# Patient Record
Sex: Female | Born: 1980
Health system: Southern US, Community
[De-identification: ages and names within clinical notes are randomized; demographics above are authoritative.]

## PROBLEM LIST (undated history)

## (undated) DIAGNOSIS — R87619 Unspecified abnormal cytological findings in specimens from cervix uteri: Secondary | ICD-10-CM

## (undated) DIAGNOSIS — N393 Stress incontinence (female) (male): Secondary | ICD-10-CM

## (undated) DIAGNOSIS — F329 Major depressive disorder, single episode, unspecified: Secondary | ICD-10-CM

## (undated) DIAGNOSIS — B379 Candidiasis, unspecified: Secondary | ICD-10-CM

## (undated) DIAGNOSIS — Z8742 Personal history of other diseases of the female genital tract: Secondary | ICD-10-CM

## (undated) DIAGNOSIS — E282 Polycystic ovarian syndrome: Secondary | ICD-10-CM

## (undated) DIAGNOSIS — N3090 Cystitis, unspecified without hematuria: Secondary | ICD-10-CM

## (undated) DIAGNOSIS — K219 Gastro-esophageal reflux disease without esophagitis: Secondary | ICD-10-CM

## (undated) DIAGNOSIS — IMO0002 Reserved for concepts with insufficient information to code with codable children: Secondary | ICD-10-CM

## (undated) DIAGNOSIS — N870 Mild cervical dysplasia: Secondary | ICD-10-CM

## (undated) DIAGNOSIS — Z87898 Personal history of other specified conditions: Secondary | ICD-10-CM

## (undated) DIAGNOSIS — E039 Hypothyroidism, unspecified: Secondary | ICD-10-CM

## (undated) DIAGNOSIS — A499 Bacterial infection, unspecified: Secondary | ICD-10-CM

## (undated) DIAGNOSIS — F419 Anxiety disorder, unspecified: Secondary | ICD-10-CM

## (undated) DIAGNOSIS — Z8619 Personal history of other infectious and parasitic diseases: Secondary | ICD-10-CM

## (undated) DIAGNOSIS — R222 Localized swelling, mass and lump, trunk: Secondary | ICD-10-CM

## (undated) HISTORY — DX: Anxiety disorder, unspecified: F41.9

## (undated) HISTORY — DX: Reserved for concepts with insufficient information to code with codable children: IMO0002

## (undated) HISTORY — DX: Candidiasis, unspecified: B37.9

## (undated) HISTORY — DX: Personal history of other infectious and parasitic diseases: Z86.19

## (undated) HISTORY — DX: Bacterial infection, unspecified: A49.9

## (undated) HISTORY — DX: Unspecified abnormal cytological findings in specimens from cervix uteri: R87.619

## (undated) HISTORY — PX: WISDOM TOOTH EXTRACTION: SHX21

## (undated) HISTORY — DX: Stress incontinence (female) (male): N39.3

## (undated) HISTORY — DX: Major depressive disorder, single episode, unspecified: F32.9

## (undated) HISTORY — DX: Mild cervical dysplasia: N87.0

## (undated) HISTORY — PX: NO PAST SURGERIES: SHX2092

## (undated) HISTORY — DX: Cystitis, unspecified without hematuria: N30.90

## (undated) HISTORY — DX: Personal history of other specified conditions: Z87.898

---
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
## 2003-10-19 DIAGNOSIS — Z87898 Personal history of other specified conditions: Secondary | ICD-10-CM

## 2003-10-19 HISTORY — DX: Personal history of other specified conditions: Z87.898

## 2004-05-19 ENCOUNTER — Other Ambulatory Visit: Admission: RE | Admit: 2004-05-19 | Discharge: 2004-05-19 | Payer: Self-pay | Admitting: Obstetrics and Gynecology

## 2005-05-27 ENCOUNTER — Other Ambulatory Visit: Admission: RE | Admit: 2005-05-27 | Discharge: 2005-05-27 | Payer: Self-pay | Admitting: Obstetrics and Gynecology

## 2006-08-10 ENCOUNTER — Other Ambulatory Visit: Admission: RE | Admit: 2006-08-10 | Discharge: 2006-08-10 | Payer: Self-pay | Admitting: Obstetrics and Gynecology

## 2008-10-18 DIAGNOSIS — N870 Mild cervical dysplasia: Secondary | ICD-10-CM

## 2008-10-18 HISTORY — PX: COLPOSCOPY: SHX161

## 2008-10-18 HISTORY — DX: Mild cervical dysplasia: N87.0

## 2009-10-18 DIAGNOSIS — F32A Depression, unspecified: Secondary | ICD-10-CM

## 2009-10-18 DIAGNOSIS — F419 Anxiety disorder, unspecified: Secondary | ICD-10-CM

## 2009-10-18 HISTORY — DX: Depression, unspecified: F32.A

## 2009-10-18 HISTORY — DX: Anxiety disorder, unspecified: F41.9

## 2011-03-19 ENCOUNTER — Ambulatory Visit: Payer: Self-pay | Admitting: General Practice

## 2012-01-26 ENCOUNTER — Ambulatory Visit: Payer: Self-pay | Admitting: Obstetrics and Gynecology

## 2012-02-16 DIAGNOSIS — N870 Mild cervical dysplasia: Secondary | ICD-10-CM | POA: Insufficient documentation

## 2012-02-16 DIAGNOSIS — Z8619 Personal history of other infectious and parasitic diseases: Secondary | ICD-10-CM | POA: Insufficient documentation

## 2012-02-17 ENCOUNTER — Encounter: Payer: Self-pay | Admitting: Obstetrics and Gynecology

## 2012-02-17 ENCOUNTER — Ambulatory Visit (INDEPENDENT_AMBULATORY_CARE_PROVIDER_SITE_OTHER): Payer: Commercial Indemnity | Admitting: Obstetrics and Gynecology

## 2012-02-17 VITALS — BP 106/62 | Ht 65.0 in | Wt 141.0 lb

## 2012-02-17 DIAGNOSIS — Z124 Encounter for screening for malignant neoplasm of cervix: Secondary | ICD-10-CM

## 2012-02-17 DIAGNOSIS — Z01419 Encounter for gynecological examination (general) (routine) without abnormal findings: Secondary | ICD-10-CM

## 2012-02-17 MED ORDER — DROSPIRENONE-ETHINYL ESTRADIOL 3-0.02 MG PO TABS: 1.0000 | ORAL_TABLET | Freq: Every day | ORAL | Status: DC

## 2012-02-17 NOTE — Progress Notes (Signed)
The patient reports:normal menses, no abnormal bleeding, pelvic pain or discharge  Contraception:oral contraceptives (estrogen/progesterone) yaz  Last mammogram: patient has never had a mammogram  Last pap: was normal March  2012  GC/Chlamydia cultures offered: declined HIV/RPR/HbsAg offered:  declined HSV 1 and 2 glycoprotein offered: declined  Menstrual cycle regular and monthly: Yes Menstrual flow normal: Yes  Urinary symptoms: none Normal bowel movements: Yes Reports abuse at home: No   Subjective:    Alicia KLOEPPEL is a 31 y.o. female, G0P0, who presents for an annual exam.     History   Social History  . Marital Status: Unknown    Spouse Name: N/A    Number of Children: N/A  . Years of Education: N/A   Social History Main Topics  . Smoking status: Never Smoker   . Smokeless tobacco: Never Used  . Alcohol Use: 6.0 oz/week    10 Cans of beer per week  . Drug Use: No  . Sexually Active: Yes    Birth Control/ Protection: Pill   Other Topics Concern  . None   Social History Narrative  . None    Menstrual cycle:   LMP: Patient's last menstrual period was 02/10/2012.           Cycle: Regular, monthly with normal flow and no severe dysmenorrha  The following portions of the patient's history were reviewed and updated as appropriate: allergies, current medications, past family history, past medical history, past social history, past surgical history and problem list.  Review of Systems Pertinent items are noted in HPI. Breast:Negative for breast lump,nipple discharge or nipple retraction Gastrointestinal: Negative for abdominal pain, change in bowel habits or rectal bleeding Urinary:negative   Objective:    BP 106/62  Ht 5\' 5"  (1.651 m)  Wt 141 lb (63.957 kg)  BMI 23.46 kg/m2  LMP 02/10/2012    Weight:  Wt Readings from Last 1 Encounters:  02/17/12 141 lb (63.957 kg)          BMI: Body mass index is 23.46 kg/(m^2).  General Appearance: Alert,  appropriate appearance for age. No acute distress HEENT: Grossly normal Neck / Thyroid: Supple, no masses, nodes or enlargement Lungs: clear to auscultation bilaterally Back: No CVA tenderness Breast Exam: No masses or nodes.No dimpling, nipple retraction or discharge. Cardiovascular: Regular rate and rhythm. S1, S2, no murmur Gastrointestinal: Soft, non-tender, no masses or organomegaly Pelvic Exam: Vulva and vagina appear normal. Bimanual exam reveals normal uterus and adnexa. Rectovaginal: not indicated Lymphatic Exam: Non-palpable nodes in neck, clavicular, axillary, or inguinal regions Skin: no rash or abnormalities Neurologic: Normal gait and speech, no tremor  Psychiatric: Alert and oriented, appropriate affect.   Wet Prep:not applicable Urinalysis:not applicable UPT: Not done   Assessment:    Normal gyn exam Contraceptive management    Plan:    pap smear return annually or prn STD screening: declined Contraception:oral contraceptives (estrogen/progesterone) Yaz Reviewed vulvar hygiene to reduce vulvitis      Thuy Atilano AMD

## 2012-02-24 ENCOUNTER — Telehealth: Payer: Self-pay

## 2012-02-24 MED ORDER — DROSPIRENONE-ETHINYL ESTRADIOL 3-0.02 MG PO TABS: 1.0000 | ORAL_TABLET | Freq: Every day | ORAL | Status: DC

## 2012-02-24 NOTE — Telephone Encounter (Signed)
Rx sent [facility] scripts per pt req.  ld

## 2013-11-06 HISTORY — PX: OTHER SURGICAL HISTORY: SHX169

## 2013-12-28 ENCOUNTER — Ambulatory Visit (HOSPITAL_COMMUNITY)
Admission: RE | Admit: 2013-12-28 | Discharge: 2013-12-28 | Disposition: A | Discharge status: Home / Self Care | Payer: BC Managed Care – PPO | Source: Ambulatory Visit | Attending: Obstetrics and Gynecology | Admitting: Obstetrics and Gynecology

## 2013-12-28 ENCOUNTER — Encounter (HOSPITAL_COMMUNITY): Payer: Self-pay | Admitting: *Deleted

## 2013-12-28 ENCOUNTER — Ambulatory Visit (HOSPITAL_COMMUNITY): Payer: BC Managed Care – PPO | Admitting: Anesthesiology

## 2013-12-28 ENCOUNTER — Encounter (HOSPITAL_COMMUNITY)
Admission: RE | Disposition: A | Discharge status: Home / Self Care | Payer: Self-pay | Source: Ambulatory Visit | Attending: Obstetrics and Gynecology

## 2013-12-28 ENCOUNTER — Encounter (HOSPITAL_COMMUNITY): Payer: BC Managed Care – PPO | Admitting: Anesthesiology

## 2013-12-28 DIAGNOSIS — O021 Missed abortion: Secondary | ICD-10-CM | POA: Insufficient documentation

## 2013-12-28 HISTORY — DX: Hypothyroidism, unspecified: E03.9

## 2013-12-28 HISTORY — PX: DILATION AND EVACUATION: SHX1459

## 2013-12-28 HISTORY — DX: Polycystic ovarian syndrome: E28.2

## 2013-12-28 LAB — CBC
HCT: 39.9 % (ref 36.0–46.0)
HEMOGLOBIN: 13.6 g/dL (ref 12.0–15.0)
MCH: 29.4 pg (ref 26.0–34.0)
MCHC: 34.1 g/dL (ref 30.0–36.0)
MCV: 86.2 fL (ref 78.0–100.0)
PLATELETS: 265 10*3/uL (ref 150–400)
RBC: 4.63 MIL/uL (ref 3.87–5.11)
RDW: 12.2 % (ref 11.5–15.5)
WBC: 13.1 10*3/uL — ABNORMAL HIGH (ref 4.0–10.5)

## 2013-12-28 LAB — ABO/RH: ABO/RH(D): O POS

## 2013-12-28 SURGERY — DILATION AND EVACUATION, UTERUS
Anesthesia: Monitor Anesthesia Care | Site: Vagina

## 2013-12-28 MED ORDER — KETOROLAC TROMETHAMINE 30 MG/ML IJ SOLN
INTRAMUSCULAR | Status: DC | PRN
  Administered 2013-12-28: 30 mg via INTRAVENOUS

## 2013-12-28 MED ORDER — FENTANYL CITRATE 0.05 MG/ML IJ SOLN: 25.0000 ug | INTRAMUSCULAR | Status: DC | PRN

## 2013-12-28 MED ORDER — GLYCOPYRROLATE 0.2 MG/ML IJ SOLN
INTRAMUSCULAR | Status: DC | PRN
  Administered 2013-12-28: 0.2 mg via INTRAVENOUS

## 2013-12-28 MED ORDER — MIDAZOLAM HCL 2 MG/2ML IJ SOLN
INTRAMUSCULAR | Status: AC
  Filled 2013-12-28: qty 2

## 2013-12-28 MED ORDER — FENTANYL CITRATE 0.05 MG/ML IJ SOLN
INTRAMUSCULAR | Status: AC
  Filled 2013-12-28: qty 2

## 2013-12-28 MED ORDER — ONDANSETRON HCL 4 MG/2ML IJ SOLN
INTRAMUSCULAR | Status: AC
  Filled 2013-12-28: qty 2

## 2013-12-28 MED ORDER — MEPERIDINE HCL 25 MG/ML IJ SOLN: 6.2500 mg | INTRAMUSCULAR | Status: DC | PRN

## 2013-12-28 MED ORDER — KETOROLAC TROMETHAMINE 30 MG/ML IJ SOLN
INTRAMUSCULAR | Status: AC
  Filled 2013-12-28: qty 1

## 2013-12-28 MED ORDER — PROMETHAZINE HCL 25 MG/ML IJ SOLN: 6.2500 mg | INTRAMUSCULAR | Status: DC | PRN

## 2013-12-28 MED ORDER — LIDOCAINE HCL 2 % IJ SOLN
INTRAMUSCULAR | Status: DC | PRN
  Administered 2013-12-28: 10 mL

## 2013-12-28 MED ORDER — LIDOCAINE HCL (CARDIAC) 20 MG/ML IV SOLN
INTRAVENOUS | Status: DC | PRN
  Administered 2013-12-28: 50 mg via INTRAVENOUS

## 2013-12-28 MED ORDER — LACTATED RINGERS IV SOLN
INTRAVENOUS | Status: DC
  Administered 2013-12-28: 12:00:00 via INTRAVENOUS

## 2013-12-28 MED ORDER — LIDOCAINE HCL (CARDIAC) 20 MG/ML IV SOLN
INTRAVENOUS | Status: AC
  Filled 2013-12-28: qty 5

## 2013-12-28 MED ORDER — ONDANSETRON HCL 4 MG/2ML IJ SOLN
INTRAMUSCULAR | Status: DC | PRN
  Administered 2013-12-28: 4 mg via INTRAVENOUS

## 2013-12-28 MED ORDER — FENTANYL CITRATE 0.05 MG/ML IJ SOLN
INTRAMUSCULAR | Status: DC | PRN
  Administered 2013-12-28: 100 ug via INTRAVENOUS

## 2013-12-28 MED ORDER — PROPOFOL 10 MG/ML IV EMUL
INTRAVENOUS | Status: DC | PRN
  Administered 2013-12-28 (×3): 50 mg via INTRAVENOUS
  Administered 2013-12-28: 30 mg via INTRAVENOUS
  Administered 2013-12-28: 50 mg via INTRAVENOUS

## 2013-12-28 MED ORDER — PROPOFOL 10 MG/ML IV EMUL
INTRAVENOUS | Status: AC
  Filled 2013-12-28: qty 20

## 2013-12-28 MED ORDER — MIDAZOLAM HCL 2 MG/2ML IJ SOLN
INTRAMUSCULAR | Status: DC | PRN
  Administered 2013-12-28: 2 mg via INTRAVENOUS

## 2013-12-28 MED ORDER — LACTATED RINGERS IV SOLN: INTRAVENOUS | Status: DC

## 2013-12-28 MED ORDER — MIDAZOLAM HCL 2 MG/2ML IJ SOLN: 0.5000 mg | Freq: Once | INTRAMUSCULAR | Status: DC | PRN

## 2013-12-28 MED ORDER — KETOROLAC TROMETHAMINE 30 MG/ML IJ SOLN: 15.0000 mg | Freq: Once | INTRAMUSCULAR | Status: DC | PRN

## 2013-12-28 MED ORDER — LIDOCAINE HCL 2 % IJ SOLN
INTRAMUSCULAR | Status: AC
  Filled 2013-12-28: qty 20

## 2013-12-28 SURGICAL SUPPLY — 21 items
CLOTH BEACON ORANGE TIMEOUT ST (SAFETY) ×3 IMPLANT
CONT PATH 16OZ SNAP LID 3702 (MISCELLANEOUS) IMPLANT
CONTAINER PREFILL 10% NBF 60ML (FORM) IMPLANT
DRAPE HYSTEROSCOPY (DRAPE) ×3 IMPLANT
GLOVE BIO SURGEON STRL SZ7.5 (GLOVE) ×3 IMPLANT
GLOVE BIOGEL PI IND STRL 7.5 (GLOVE) ×2 IMPLANT
GLOVE BIOGEL PI INDICATOR 7.5 (GLOVE) ×4
GOWN STRL REUS W/TWL LRG LVL3 (GOWN DISPOSABLE) ×6 IMPLANT
KIT BERKELEY 2ND TRIMESTER 1/2 (COLLECTOR) ×3 IMPLANT
NEEDLE SPNL 22GX3.5 QUINCKE BK (NEEDLE) ×3 IMPLANT
NS IRRIG 1000ML POUR BTL (IV SOLUTION) ×3 IMPLANT
PACK VAGINAL MINOR WOMEN LF (CUSTOM PROCEDURE TRAY) ×3 IMPLANT
PAD OB MATERNITY 4.3X12.25 (PERSONAL CARE ITEMS) ×3 IMPLANT
PAD PREP 24X48 CUFFED NSTRL (MISCELLANEOUS) ×3 IMPLANT
SYR CONTROL 10ML LL (SYRINGE) ×3 IMPLANT
TOWEL OR 17X24 6PK STRL BLUE (TOWEL DISPOSABLE) ×3 IMPLANT
TUBE VACURETTE 2ND TRIMESTER (CANNULA) IMPLANT
VACURETTE 12 RIGID CVD (CANNULA) IMPLANT
VACURETTE 14MM CVD 1/2 BASE (CANNULA) IMPLANT
VACURETTE 16MM ASPIR CVD .5 (CANNULA) IMPLANT
VACURETTE 7MM CVD STRL WRAP (CANNULA) ×3 IMPLANT

## 2013-12-28 NOTE — Addendum Note (Signed)
Addendum created 12/28/13 1423 by Assunta Gambles, MD   Modules edited: Notes Section   Notes Section:  File: [id number]

## 2013-12-28 NOTE — Anesthesia Postprocedure Evaluation (Addendum)
  Anesthesia Post-op Note  Anesthesia Post Note  Patient: Alicia Stokes  Procedure(s) Performed: Procedure(s) (LRB): DILATATION AND EVACUATION (N/A)  Anesthesia type: MAC  Patient location: PACU  Post pain: Pain level controlled  Post assessment: Post-op Vital signs reviewed  Last Vitals:  Filed Vitals:   12/28/13 1345  BP: 103/55  Pulse: 61  Temp: 36.7 C  Resp: 16    Post vital signs: Reviewed  Level of consciousness: sedated  Complications: No apparent anesthesia complications

## 2013-12-28 NOTE — Anesthesia Preprocedure Evaluation (Signed)
Anesthesia Evaluation  Patient identified by MRN, date of birth, ID band Patient awake    Reviewed: Allergy & Precautions, H&P , Patient's Chart, lab work & pertinent test results, reviewed documented beta blocker date and time   History of Anesthesia Complications Negative for: history of anesthetic complications  Airway Mallampati: II TM Distance: >3 FB Neck ROM: full    Dental   Pulmonary  breath sounds clear to auscultation        Cardiovascular Exercise Tolerance: Good Rhythm:regular Rate:Normal     Neuro/Psych negative psych ROS   GI/Hepatic   Endo/Other    Renal/GU      Musculoskeletal   Abdominal   Peds  Hematology   Anesthesia Other Findings   Reproductive/Obstetrics                           Anesthesia Physical Anesthesia Plan  ASA: II  Anesthesia Plan: MAC   Post-op Pain Management:    Induction:   Airway Management Planned:   Additional Equipment:   Intra-op Plan:   Post-operative Plan:   Informed Consent: I have reviewed the patients History and Physical, chart, labs and discussed the procedure including the risks, benefits and alternatives for the proposed anesthesia with the patient or authorized representative who has indicated his/her understanding and acceptance.   Dental Advisory Given  Plan Discussed with: CRNA, Surgeon and Anesthesiologist  Anesthesia Plan Comments:         Anesthesia Quick Evaluation  

## 2013-12-28 NOTE — Transfer of Care (Signed)
Immediate Anesthesia Transfer of Care Note  Patient: Alicia Stokes  Procedure(s) Performed: Procedure(s): DILATATION AND EVACUATION (N/A)  Patient Location: PACU  Anesthesia Type:MAC  Level of Consciousness: awake, alert  and oriented  Airway & Oxygen Therapy: Patient Spontanous Breathing  Post-op Assessment: Report given to PACU RN and Post -op Vital signs reviewed and stable  Post vital signs: Reviewed and stable  Complications: No apparent anesthesia complications

## 2013-12-28 NOTE — Discharge Instructions (Signed)

## 2013-12-28 NOTE — H&P (Signed)
Alicia Stokes is an 33 y.o. female.  Presents for scheduled D&E secondary to missed AB.  Pertinent Gynecological History: H/o infertility  No LMP recorded. Patient is pregnant. followed by [facility] (Dr. Rolin Stokes) s/p IVF    Past Medical History  Diagnosis Date  . Anxiety 2011  . Depression 2011  . Abnormal pap 2010 ascus   . Abnormal Pap smear   . Hypothyroidism   . Polycystic disease, ovaries     Past Surgical History  Procedure Laterality Date  . Wisdom tooth extraction  2000  . Invitro  11/06/13    Family History  Problem Relation Age of Onset  . Depression Alicia Stokes   . Hyperthyroidism Alicia Stokes   . Lung disease Alicia Stokes 20    has one lung due to tb  . Diabetes Alicia Stokes   . Hypertension Alicia Stokes   . COPD Alicia Stokes     Social History:  reports that she has never smoked. She has never used smokeless tobacco. She reports that she drinks about 6.0 ounces of alcohol per week. She reports that she does not use illicit drugs.  Allergies: No Known Allergies  Prescriptions prior to admission  Medication Sig Dispense Refill  . levothyroxine (SYNTHROID, LEVOTHROID) 50 MCG tablet Take 50-75 mcg by mouth daily before breakfast. Patient takes 51mcg 4 times a week and 75 mcg 3 times a week      . Prenatal Vit-Fe Fumarate-FA (PRENATAL MULTIVITAMIN) TABS tablet Take 1 tablet by mouth daily at 12 noon.      . ranitidine (ZANTAC) 150 MG capsule Take 150 mg by mouth 2 (two) times daily.        ROS Non-contributory  Blood pressure 112/56, pulse 66, temperature 98.1 F (36.7 C), temperature source Oral, resp. rate 16, height 5\' 5"  (1.651 m), weight 70.308 kg (155 lb), SpO2 99.00%. Physical Exam  Lungs CTA bilaterally CV RRR Abd soft, NT Ext no calf tenderness  Results for orders placed during the hospital encounter of 12/28/13 (from the past 24 hour(s))  ABO/RH     Status: None   Collection Time    12/28/13 11:15 AM      Result Value  Ref Range   ABO/RH(D) O POS    CBC     Status: Abnormal   Collection Time    12/28/13 11:15 AM      Result Value Ref Range   WBC 13.1 (*) 4.0 - 10.5 K/uL   RBC 4.63  3.87 - 5.11 MIL/uL   Hemoglobin 13.6  12.0 - 15.0 g/dL   HCT 39.9  36.0 - 46.0 %   MCV 86.2  78.0 - 100.0 fL   MCH 29.4  26.0 - 34.0 pg   MCHC 34.1  30.0 - 36.0 g/dL   RDW 12.2  11.5 - 15.5 %   Platelets 265  150 - 400 K/uL    Assessment/Plan: P0 at about 9wks with missed ab here for scheduled D&E.  R/B/A discussed, pt verbalized understanding and consent signed and witnessed.    Alicia Stokes 12/28/2013, 12:11 PM

## 2013-12-28 NOTE — Op Note (Signed)
Preop Diagnosis: Missed Abortion 9 weeks   Postop Diagnosis: Missed Abortion 9 weeks   Procedure: DILATATION AND EVACUATION   Anesthesia: Monitor Anesthesia Care   Attending: No att. providers found   Assistant: N/a  Findings: Mod POCs  Pathology: POCs  Fluids: See Flowsheet  UOP: Straight cath prior to procedure  EBL: Minimal  Complications: None  Procedure: The patient was taken to the operating room after the risks benefits and alternatives were discussed with the patient, the patient verbalized understanding and consent signed and witnessed.  The patient was placed under MAC anesthesia, prepped and draped in the normal sterile fashion and a time out was performed.  A bivalve speculum was placed in the patient's vagina and the anterior lip of the cervix grasped with a single-tooth tenaculum. A paracervical block was administered using a total of 10 cc of 2% lidocaine.  The uterus was sounded to 7 cm and a size 6 suction curette was used. Suction curettage was performed until minimal tissue returned. Sharp curettage was performed until a gritty texture was noted. Suction curettage was performed once again to remove any remaining debris. All instruments were removed. The count was correct. The patient was transferred to the recovery room in good condition.

## 2013-12-31 ENCOUNTER — Encounter (HOSPITAL_COMMUNITY): Payer: Self-pay | Admitting: Obstetrics and Gynecology

## 2014-02-12 ENCOUNTER — Other Ambulatory Visit: Payer: Self-pay

## 2014-03-14 ENCOUNTER — Other Ambulatory Visit: Payer: Self-pay

## 2014-06-10 LAB — OB RESULTS CONSOLE GC/CHLAMYDIA
Chlamydia: NEGATIVE
GC PROBE AMP, GENITAL: NEGATIVE

## 2014-06-10 LAB — OB RESULTS CONSOLE HIV ANTIBODY (ROUTINE TESTING): HIV: NONREACTIVE

## 2014-06-10 LAB — OB RESULTS CONSOLE RPR: RPR: NONREACTIVE

## 2014-06-10 LAB — OB RESULTS CONSOLE RUBELLA ANTIBODY, IGM: Rubella: IMMUNE

## 2014-06-10 LAB — OB RESULTS CONSOLE HEPATITIS B SURFACE ANTIGEN: Hepatitis B Surface Ag: NEGATIVE

## 2014-08-22 DIAGNOSIS — N469 Male infertility, unspecified: Secondary | ICD-10-CM | POA: Insufficient documentation

## 2014-09-10 ENCOUNTER — Other Ambulatory Visit (HOSPITAL_COMMUNITY): Payer: Self-pay | Admitting: Obstetrics & Gynecology

## 2014-09-10 DIAGNOSIS — O280 Abnormal hematological finding on antenatal screening of mother: Secondary | ICD-10-CM

## 2014-09-18 ENCOUNTER — Ambulatory Visit (HOSPITAL_COMMUNITY)
Admission: RE | Admit: 2014-09-18 | Discharge: 2014-09-18 | Disposition: A | Discharge status: Home / Self Care | Payer: BC Managed Care – PPO | Source: Ambulatory Visit | Attending: Obstetrics & Gynecology | Admitting: Obstetrics & Gynecology

## 2014-09-18 ENCOUNTER — Encounter (HOSPITAL_COMMUNITY): Payer: Self-pay

## 2014-09-18 DIAGNOSIS — Z3A22 22 weeks gestation of pregnancy: Secondary | ICD-10-CM | POA: Diagnosis not present

## 2014-09-18 DIAGNOSIS — O99282 Endocrine, nutritional and metabolic diseases complicating pregnancy, second trimester: Secondary | ICD-10-CM | POA: Diagnosis not present

## 2014-09-18 DIAGNOSIS — E282 Polycystic ovarian syndrome: Secondary | ICD-10-CM | POA: Diagnosis not present

## 2014-09-18 DIAGNOSIS — O280 Abnormal hematological finding on antenatal screening of mother: Secondary | ICD-10-CM

## 2014-09-18 DIAGNOSIS — O09812 Supervision of pregnancy resulting from assisted reproductive technology, second trimester: Secondary | ICD-10-CM | POA: Insufficient documentation

## 2014-09-18 DIAGNOSIS — O289 Unspecified abnormal findings on antenatal screening of mother: Secondary | ICD-10-CM | POA: Diagnosis not present

## 2014-09-18 DIAGNOSIS — O3482 Maternal care for other abnormalities of pelvic organs, second trimester: Secondary | ICD-10-CM | POA: Diagnosis not present

## 2014-09-18 DIAGNOSIS — E039 Hypothyroidism, unspecified: Secondary | ICD-10-CM | POA: Diagnosis not present

## 2014-09-18 DIAGNOSIS — Z315 Encounter for genetic counseling: Secondary | ICD-10-CM | POA: Diagnosis not present

## 2014-09-18 NOTE — Progress Notes (Signed)
Genetic Counseling  High-Risk Gestation Note  Appointment Date:  09/18/2014 Referred By: Alinda Dooms, MD Date of Birth:  [birth year] Partner:  Harvest Dark    Pregnancy History: [id number] Estimated Date of Delivery: 01/16/15 Estimated Gestational Age: [redacted]w[redacted]d Attending: Renella Cunas, MD   Alicia Stokes was seen for genetic counseling because of an elevated MSAFP of 3.21 MoMs based on maternal serum screening.  Mrs. Torosian' mother also attended the genetic counseling appointment today.  We reviewed Mrs. Wareing' maternal serum screening result, the elevation of MSAFP, and the associated 1 in 60 risk for a fetal open neural tube defect.   We reviewed ONTDs, the typical multifactorial etiology, and variable prognosis.  In addition, we discussed additional explanations for an elevated MSAFP including: normal variation, twins, feto-maternal bleeding, a gestational dating error, abdominal wall defects, kidney differences, oligohydramnios, and placental problems.  We discussed that an unexplained elevation of MSAFP is associated with an increased risk for third trimester complications including: prematurity, low birth weight, and pre-eclampsia.    We reviewed additional available screening and diagnostic options including detailed ultrasound and amniocentesis.  We discussed the risks, limitations, and benefits of each.  After thoughtful consideration of these options, Mrs. Sproull elected to have ultrasound, but declined amniocentesis.  She understands that ultrasound cannot rule out all birth defects or genetic syndromes.  However, she was counseled that approximately 90% of fetuses with ONTDs can be detected by detailed 2nd trimester ultrasound, when well visualized.  A complete ultrasound was performed today.  There were no fetal anomalies or markers for ONTDs or aneuploidy visualized.  The ultrasound report will be documented separately.  Mrs. Stigler was provided with written information  regarding cystic fibrosis (CF) including the carrier frequency and incidence in the Caucasian population, the availability of carrier testing and prenatal diagnosis if indicated.  In addition, we discussed that CF is routinely screened for as part of the [place] newborn screening panel.  She declined testing today.   Both family histories were reviewed and found to be noncontributory for birth defects, intellectual disability, and known genetic conditions.  Without further information regarding the provided family history, an accurate genetic risk cannot be calculated. Further genetic counseling is warranted if more information is obtained.  Mrs. Kolinski denied exposure to environmental toxins or chemical agents. She denied the use of alcohol, tobacco or street drugs. She denied significant viral illnesses during the course of her pregnancy. Her medical and surgical histories were contributory for hypothyroidism.   I counseled Mrs. Milhoan for approximately 32 minutes regarding the above risks and available options.    Filbert Schilder, MS  Certified Genetic Counselor

## 2014-10-01 ENCOUNTER — Other Ambulatory Visit (HOSPITAL_COMMUNITY): Payer: Self-pay

## 2014-10-18 NOTE — L&D Delivery Note (Signed)
Delivery Note At 12:46 PM a viable female, "Alicia Stokes", was delivered via Vaginal, Spontaneous Delivery (Presentation: Right Occiput Anterior).  APGAR: 8, 9; weight--pushed x 2 hrs 46 min. Placenta status: Intact, Spontaneous Pathology.  Cord: 3 vessels with the following complications: Velamentous and marginal insertion.  Cord pH: NA Loose CAN x 1, delivered through. NICU team at delivery due to pre-term and prolonged ROM.  No issues, left with mother.  Anesthesia: Epidural  Episiotomy: None Lacerations: Deep 2nd degree perineal--no transection of capsule or rectum. Suture Repair: 3.0 vicryl Est. Blood Loss (mL): 500 cc--several large clots passed with fundal massage after repair.  Fundus firm after that.  Mom to postpartum.  Baby to Couplet care / Skin to Skin. Family plans inpatient circumcision. Placenta to pathology. Will continue Synthroid per home dosage, 88 mcg/day.  Cadyn Rodger 12/23/2014, 1:50 PM

## 2014-12-22 ENCOUNTER — Inpatient Hospital Stay (HOSPITAL_COMMUNITY)
Admission: AD | Admit: 2014-12-22 | Discharge: 2014-12-25 | DRG: 775 | Disposition: A | Discharge status: Home / Self Care | Payer: BLUE CROSS/BLUE SHIELD | Source: Ambulatory Visit | Attending: Obstetrics and Gynecology | Admitting: Obstetrics and Gynecology

## 2014-12-22 ENCOUNTER — Encounter (HOSPITAL_COMMUNITY): Payer: Self-pay | Admitting: *Deleted

## 2014-12-22 DIAGNOSIS — Z3A36 36 weeks gestation of pregnancy: Secondary | ICD-10-CM | POA: Diagnosis present

## 2014-12-22 DIAGNOSIS — O43123 Velamentous insertion of umbilical cord, third trimester: Secondary | ICD-10-CM | POA: Diagnosis present

## 2014-12-22 DIAGNOSIS — O42013 Preterm premature rupture of membranes, onset of labor within 24 hours of rupture, third trimester: Secondary | ICD-10-CM | POA: Diagnosis present

## 2014-12-22 DIAGNOSIS — Z3483 Encounter for supervision of other normal pregnancy, third trimester: Secondary | ICD-10-CM | POA: Diagnosis present

## 2014-12-22 LAB — CBC
HCT: 38.2 % (ref 36.0–46.0)
HEMOGLOBIN: 12.9 g/dL (ref 12.0–15.0)
MCH: 29.3 pg (ref 26.0–34.0)
MCHC: 33.8 g/dL (ref 30.0–36.0)
MCV: 86.6 fL (ref 78.0–100.0)
PLATELETS: 163 10*3/uL (ref 150–400)
RBC: 4.41 MIL/uL (ref 3.87–5.11)
RDW: 12.9 % (ref 11.5–15.5)
WBC: 14.9 10*3/uL — AB (ref 4.0–10.5)

## 2014-12-22 LAB — TYPE AND SCREEN
ABO/RH(D): O POS
ANTIBODY SCREEN: NEGATIVE

## 2014-12-22 LAB — POCT FERN TEST: POCT Fern Test: POSITIVE

## 2014-12-22 LAB — OB RESULTS CONSOLE GBS: STREP GROUP B AG: NEGATIVE

## 2014-12-22 LAB — RPR: RPR Ser Ql: NONREACTIVE

## 2014-12-22 LAB — GROUP B STREP BY PCR: Group B strep by PCR: NEGATIVE

## 2014-12-22 MED ORDER — NALBUPHINE HCL 10 MG/ML IJ SOLN
10.0000 mg | INTRAMUSCULAR | Status: DC | PRN
  Administered 2014-12-23: 10 mg via INTRAVENOUS
  Filled 2014-12-22: qty 1

## 2014-12-22 MED ORDER — LACTATED RINGERS IV SOLN
INTRAVENOUS | Status: DC
  Administered 2014-12-22 – 2014-12-23 (×4): via INTRAVENOUS

## 2014-12-22 MED ORDER — DIPHENHYDRAMINE HCL 50 MG/ML IJ SOLN: 12.5000 mg | INTRAMUSCULAR | Status: DC | PRN

## 2014-12-22 MED ORDER — MISOPROSTOL 50MCG HALF TABLET
50.0000 ug | ORAL_TABLET | ORAL | Status: DC | PRN
  Administered 2014-12-22 (×3): 50 ug via ORAL
  Filled 2014-12-22 (×3): qty 0.5
  Filled 2014-12-22 (×2): qty 1

## 2014-12-22 MED ORDER — LEVOTHYROXINE SODIUM 88 MCG PO TABS
88.0000 ug | ORAL_TABLET | Freq: Every day | ORAL | Status: DC
  Administered 2014-12-23 – 2014-12-25 (×3): 88 ug via ORAL
  Filled 2014-12-22 (×5): qty 1

## 2014-12-22 MED ORDER — OXYTOCIN 40 UNITS IN LACTATED RINGERS INFUSION - SIMPLE MED
1.0000 m[IU]/min | INTRAVENOUS | Status: DC
  Administered 2014-12-23: 2 m[IU]/min via INTRAVENOUS
  Filled 2014-12-22: qty 1000

## 2014-12-22 MED ORDER — DEXTROSE 5 % IV SOLN
2.5000 10*6.[IU] | INTRAVENOUS | Status: DC
  Administered 2014-12-22: 2.5 10*6.[IU] via INTRAVENOUS
  Filled 2014-12-22 (×5): qty 2.5

## 2014-12-22 MED ORDER — ACETAMINOPHEN 325 MG PO TABS: 650.0000 mg | ORAL_TABLET | ORAL | Status: DC | PRN

## 2014-12-22 MED ORDER — LACTATED RINGERS IV SOLN
500.0000 mL | Freq: Once | INTRAVENOUS | Status: AC
  Administered 2014-12-23: 03:00:00 via INTRAVENOUS

## 2014-12-22 MED ORDER — LIDOCAINE HCL (PF) 1 % IJ SOLN
30.0000 mL | INTRAMUSCULAR | Status: DC | PRN
  Administered 2014-12-23: 30 mL via SUBCUTANEOUS
  Filled 2014-12-22 (×2): qty 30

## 2014-12-22 MED ORDER — FENTANYL 2.5 MCG/ML BUPIVACAINE 1/10 % EPIDURAL INFUSION (WH - ANES)
14.0000 mL/h | INTRAMUSCULAR | Status: DC | PRN
  Administered 2014-12-23 (×2): 14 mL/h via EPIDURAL
  Filled 2014-12-22 (×2): qty 125

## 2014-12-22 MED ORDER — PENICILLIN G POTASSIUM 5000000 UNITS IJ SOLR
5.0000 10*6.[IU] | Freq: Once | INTRAVENOUS | Status: AC
  Administered 2014-12-22: 5 10*6.[IU] via INTRAVENOUS
  Filled 2014-12-22: qty 5

## 2014-12-22 MED ORDER — PHENYLEPHRINE 40 MCG/ML (10ML) SYRINGE FOR IV PUSH (FOR BLOOD PRESSURE SUPPORT)
80.0000 ug | PREFILLED_SYRINGE | INTRAVENOUS | Status: DC | PRN
  Filled 2014-12-22: qty 2
  Filled 2014-12-22: qty 20

## 2014-12-22 MED ORDER — OXYCODONE-ACETAMINOPHEN 5-325 MG PO TABS: 2.0000 | ORAL_TABLET | ORAL | Status: DC | PRN

## 2014-12-22 MED ORDER — ONDANSETRON HCL 4 MG/2ML IJ SOLN
4.0000 mg | Freq: Four times a day (QID) | INTRAMUSCULAR | Status: DC | PRN
  Administered 2014-12-23: 4 mg via INTRAVENOUS
  Filled 2014-12-22: qty 2

## 2014-12-22 MED ORDER — EPHEDRINE 5 MG/ML INJ
10.0000 mg | INTRAVENOUS | Status: DC | PRN
  Filled 2014-12-22: qty 2

## 2014-12-22 MED ORDER — OXYTOCIN 40 UNITS IN LACTATED RINGERS INFUSION - SIMPLE MED
62.5000 mL/h | INTRAVENOUS | Status: DC
  Administered 2014-12-23: 62.5 mL/h via INTRAVENOUS

## 2014-12-22 MED ORDER — CITRIC ACID-SODIUM CITRATE 334-500 MG/5ML PO SOLN: 30.0000 mL | ORAL | Status: DC | PRN

## 2014-12-22 MED ORDER — LACTATED RINGERS IV SOLN
500.0000 mL | INTRAVENOUS | Status: DC | PRN
  Administered 2014-12-23: 500 mL via INTRAVENOUS

## 2014-12-22 MED ORDER — PHENYLEPHRINE 40 MCG/ML (10ML) SYRINGE FOR IV PUSH (FOR BLOOD PRESSURE SUPPORT)
80.0000 ug | PREFILLED_SYRINGE | INTRAVENOUS | Status: DC | PRN
  Administered 2014-12-23: 80 ug via INTRAVENOUS
  Filled 2014-12-22: qty 2

## 2014-12-22 MED ORDER — OXYTOCIN BOLUS FROM INFUSION: 500.0000 mL | INTRAVENOUS | Status: DC

## 2014-12-22 MED ORDER — OXYCODONE-ACETAMINOPHEN 5-325 MG PO TABS
1.0000 | ORAL_TABLET | ORAL | Status: DC | PRN
  Filled 2014-12-22 (×3): qty 1

## 2014-12-22 MED ORDER — TERBUTALINE SULFATE 1 MG/ML IJ SOLN: 0.2500 mg | Freq: Once | INTRAMUSCULAR | Status: AC | PRN

## 2014-12-22 NOTE — Progress Notes (Signed)
Labor Progress  Subjective: Comfortable, no complaints.  Reviewed the need for augmentation, pt agreed  Objective: BP 116/66 mmHg  Pulse 93  Temp(Src) 97.9 F (36.6 C) (Oral)  Resp 18  Ht 5\' 5"  (1.651 m)  Wt 203 lb (92.08 kg)  BMI 33.78 kg/m2    FHT: 125 moderate variability, + accel, no decel CTX:  irregular, every 5-8 minutes Uterus gravid, soft non tender SVE:  1/50/-3  Assessment:  IUP at 36.3 weeks NICHD: Category 1 Membranes:  SROM x 28hrs, no s/s of infection Labor progress: augmentation of labor GBS: unknown   Plan: Continue labor plan Continuous monitoring Frequent position changes to facilitate fetal rotation and descent. Will reassess with cervical exam at 1630 or earlier if necessary Cytotec PO     Jakye Mullens, CNM, MSN 12/22/2014. 12:20 PM

## 2014-12-22 NOTE — Progress Notes (Addendum)
Labor Progress  Subjective: No complaints, FOB at the bedside  Objective: BP 125/70 mmHg  Pulse 71  Temp(Src) 98.4 F (36.9 C) (Oral)  Resp 18  Ht 5\' 5"  (1.651 m)  Wt 203 lb (92.08 kg)  BMI 33.78 kg/m2     FHT: 135, moderate variability, no decel, + accel CTX:  irritibility Uterus gravid, soft non tender SVE:  Dilation: 2 Effacement (%): 60 Station: -2 Exam by:: Venues Starnad   Assessment:  IUP at 36.3 weeks NICHD: Category 1 Membranes:  SROM x 36.5hrs, no s/s of infection Labor progress: augmentation for PPROM GBS: negative   Plan: Continue labor plan Continuous monitoring Rest/Ambulate Frequent position changes to facilitate fetal rotation and descent. Will reassess with cervical exam at 0030 or earlier if necessary Cytotec #3 Start pitocin per protocol at next check       Alicia Stokes, CNM, MSN 12/22/2014. 10:23 PM

## 2014-12-22 NOTE — MAU Note (Signed)
Gypsy Balsam RN CN in [facility] called with report. Will call back with room number

## 2014-12-22 NOTE — MAU Note (Signed)
Had increased vag d/c Sat morning. Sometimes leak urine. THis am awoke about 0330 and panties and pants were wet. Clear fld. Have continued to leak since then. Having mild cramping.

## 2014-12-22 NOTE — Plan of Care (Signed)
Problem: Phase I Progression Outcomes Goal: FHR checked 5 minutes after meds (ROM) Rupture of Membranes SROM AT HOME

## 2014-12-22 NOTE — Progress Notes (Signed)
Gavin Pound CNM notified of pt's admission and ? SROM. Will see pt and eval for SROM. Aware FM strip flat initially but more variability since turning to side

## 2014-12-22 NOTE — H&P (Signed)
Alicia Stokes is a 34 y.o. female, G2P0010 at 36.3 weeks, presenting for pPROM. Patient reports that she started leaking fluid on 12/21/14 around 0800.  Patient reports she thought she was having urinary leakage and started wearing pantyliners.  However, patient awoke at 0300 on 12/22/2014 with saturated pantyliner and underwear.  Patient denies VB and reports active fetus.  Pregnancy via IVF, GBS unknown and patient with abnormal AFP with increased risk for open spinal bifida noted.  Patient also with hypothyroidism and currently taking synthroid daily.   Patient Active Problem List   Diagnosis Date Noted  . Abnormal MSAFP (maternal serum alpha-fetoprotein), elevated 09/18/2014  . [redacted] weeks gestation of pregnancy 09/18/2014  . Abnormal antenatal AFP screen   . Encounter for routine screening for malformation using ultrasonics   . Dysplasia of cervix, low grade (CIN 1) 02/16/2012  . 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   EDC of 01/16/2015 was established by 6.4 wk [place] on 05/29/2014.   Anatomy scan:  18.4 weeks, with normal, but limited, findings and an posterior placenta.   Additional [place] evaluations:   -Anatomy: NORMAL BUT NEEDS COMPLETION OF CARDIAC AND PLACENTAL CORD INSERTION. POSTERIOR LATERAL PLACENTA  -26.5wks: EFW 95.4%. CX 4.38 CM. SIUP VTX.  -30.5wks: SIUP, VERTEX, NORMAL FLUID, CX 3.97 CM, GROWTH 85%. 34.5wks: SIUP, VERTEX, NORMAL FLUID, GROWTH 93%. MACROSOMIA DISCUSSED Significant prenatal events:  Pregnancy via IVF---frozen embryo transplant.  AFP results reviewed increased risk for OSB, 1/60---MFM consult with normal repeat US.  Recommeneded and received serial growth [place].  Patient with history of hypothyroidism and taking 68mcg synthroid daily---followed by Alicia Stokes.   Patient with common pregnancy complaints including N/V and acid reflux.  Patient with abnormal one hour GTT, but normal 3 hr GTT.  Patient also with  rash on breast at 28.5wks.  Last evaluation:  12/10/2014 by Alicia Stokes.  FHR 145, BP 110/68, wt 201lbs  OB History    Gravida Para Term Preterm AB TAB SAB Ectopic Multiple Living   2  0 0 1 0 1 0 0 0     Past Medical History  Diagnosis Date  . Yeast infection   . Bacterial infection   . Bladder infection   . History of chicken pox   . CIN I (cervical intraepithelial neoplasia I) 2010  . H/O urinary frequency 2005  . Stress incontinence   . Anxiety 2011  . Depression 2011  . Abnormal pap 2010 ascus   . Abnormal Pap smear   . Hypothyroidism   . Polycystic disease, ovaries    Past Surgical History  Procedure Laterality Date  . Wisdom tooth extraction    . Colposcopy  2010  . Wisdom tooth extraction  2000  . Invitro  11/06/13  . Dilation and evacuation N/A 12/28/2013    Procedure: DILATATION AND EVACUATION;  Surgeon: Alicia Lesch, MD;  Location: [facility];  Service: Gynecology;  Laterality: N/A;   Family History: family history includes COPD in her paternal grandmother; Depression in her mother; Diabetes in her maternal grandfather; Hypertension in her maternal grandfather; Hyperthyroidism in her father; Lung disease (age of onset: 39) in her maternal grandmother. Social History:  reports that she has never smoked. She has never used smokeless tobacco. She reports that she drinks about 6.0 oz of alcohol per week. She reports that she does not use illicit drugs.   Prenatal Transfer Tool  Maternal Diabetes: No Genetic Screening: Abnormal:  Results: Elevated AFP Maternal Ultrasounds/Referrals: Normal Fetal Ultrasounds or other Referrals:  None Maternal Substance Abuse:  No Significant Maternal Medications:  Meds include: Syntroid, Protonix, Diclegis Significant Maternal Lab Results: Lab values include: Other: GBS Unknown    ROS:  +LOF, +FM, -Ctx, -VB  No Known Allergies   Dilation: 1.5 Effacement (%): 50 Station: -3 Exam by:: Alicia Stokes CNM Blood pressure 133/87, pulse  103, temperature 98.9 F (37.2 C), resp. rate 18, height 5' 5.5" (1.664 m), weight 203 lb 3.2 oz (92.171 kg).  General: No distress Bright affect Chest: No respiratory distress, Heart RRR  Breast: Not examined Abdomen: Soft, Appears LGA, NT Leopolds: Vertex, EFW ~7 1/4lbs Pelvic: As above Vagina: Clear fluid noted on perineum, vault +pooling, white plaques, + leaking from cervix with vagal Skin: Warm Dry Ext: WNL, some mild pedial edema  FHR: 155 bpm, Mod Var, -Decels, +Accels UCs:  Q1-82min, palpates mild  Prenatal labs: ABO, Rh: --/--/O POS (03/13 1115) Antibody:  Negative Rubella:   Immune RPR:   NR HBsAg:   Negative HIV:   NR GBS:  Unknown-Cultures Sent Sickle cell/Hgb electrophoresis:  N/A Pap:  Normal GC:  Negative Chlamydia:  Negative Genetic screenings:  Abnormal AFP Glucola:  Abnormal 1 hour, Normal 3 hr Other:  TSH Normal 08/19/2014  Assessment IUP at 36.3wks Cat I FT pPROM GBS Uknown IVF Pregnancy Abnormal AFP  Plan: Admit to [facility] per consult with Alicia Stokes. [place] Routine Labor and Delivery Orders per [facility] Protocol GBS culture obtained, start PCN prophylaxis Report given to Alicia Stokes, CNM and Alicia Stokes Plan to start po cytotec x 3 doses then pitocin 2x2  Alicia Stokes, Alicia LYNNCNM, Alicia Stokes 12/22/2014, 6:46 AM

## 2014-12-22 NOTE — Progress Notes (Signed)
Labor Progress  Subjective: Comfortable, no complaints.  Family members at the bedside for support and encouragement  Objective: BP 129/77 mmHg  Pulse 72  Temp(Src) 97.9 F (36.6 C) (Axillary)  Resp 20  Ht 5\' 5"  (1.651 m)  Wt 203 lb (92.08 kg)  BMI 33.78 kg/m2     FHT: 135 CTX:  irregular, every 4-7 minutes, mild Uterus gravid, soft non tender SVE:  Dilation: 2 Effacement (%): 60 Station: -2 Exam by:: VSTandard,cnm   Assessment:  IUP at 36.3 weeks NICHD: Category 1 Membranes:  SROM x 32hrs, no s/s of infection Labor progress: augmentation of labor for prolong ROM GBS: negative    Plan: Continue labor plan Continuous monitoring PO Cytotec #2 Wireless monitors Ambulate to encourage dilation Will reassess with cervical exam at 2030 or earlier if necessary DC PCN    Alicia Stokes, CNM, MSN 12/22/2014. 4:32 PM

## 2014-12-22 NOTE — Progress Notes (Addendum)
Labor Progress  Subjective: Pt started ctx q 3-4 after she arrived on L&D.  She report the ctx as mild.  She denies vb w/+FM.  We review augumentation.  Pt prefers labor to start on it own.  Objective: BP 130/85 mmHg  Pulse 87  Temp(Src) 98.9 F (37.2 C)  Resp 18  Ht 5\' 5"  (1.651 m)  Wt 203 lb (92.08 kg)  BMI 33.78 kg/m2     FHT: 140 +accel, moderate varibility CTX:  regular, every 3-4 minutes Uterus gravid, soft non tender SVE:  1/50/-3 posterior   Assessment:  IUP at 36.3 weeks NICHD: Category 1 Membranes:  SROM x 24hrs, no s/s of infection Labor progress: PPROM GBS: pending    Plan: Continue labor plan Continuous monitoring Delay augmentation until noon Regular diet for now Ambulate and frequent position changes to facilitate dilation  Will reassess with cervical exam at 1200 or earlier if necessary Will start PO Cytotec at 1200 if no cervical change PCN for GBS unknown      Valborg Friar, CNM, MSN 12/22/2014. 8:49 AM

## 2014-12-22 NOTE — Progress Notes (Signed)
Milinda Cave CNM in for spec exam to r/o srom. GBS PCR obtained and sent

## 2014-12-23 ENCOUNTER — Encounter (HOSPITAL_COMMUNITY): Payer: Self-pay

## 2014-12-23 ENCOUNTER — Inpatient Hospital Stay (HOSPITAL_COMMUNITY): Payer: BLUE CROSS/BLUE SHIELD | Admitting: Anesthesiology

## 2014-12-23 LAB — ABO/RH: ABO/RH(D): O POS

## 2014-12-23 MED ORDER — ONDANSETRON HCL 4 MG/2ML IJ SOLN: 4.0000 mg | INTRAMUSCULAR | Status: DC | PRN

## 2014-12-23 MED ORDER — WITCH HAZEL-GLYCERIN EX PADS
1.0000 "application " | MEDICATED_PAD | CUTANEOUS | Status: DC | PRN
  Administered 2014-12-23: 1 via TOPICAL

## 2014-12-23 MED ORDER — LIDOCAINE HCL (PF) 1 % IJ SOLN
INTRAMUSCULAR | Status: DC | PRN
  Administered 2014-12-23 (×2): 4 mL

## 2014-12-23 MED ORDER — OXYCODONE-ACETAMINOPHEN 5-325 MG PO TABS: 2.0000 | ORAL_TABLET | ORAL | Status: DC | PRN

## 2014-12-23 MED ORDER — IBUPROFEN 600 MG PO TABS
600.0000 mg | ORAL_TABLET | Freq: Four times a day (QID) | ORAL | Status: DC
  Administered 2014-12-23 – 2014-12-25 (×8): 600 mg via ORAL
  Filled 2014-12-23 (×8): qty 1

## 2014-12-23 MED ORDER — DIBUCAINE 1 % RE OINT: 1.0000 "application " | TOPICAL_OINTMENT | RECTAL | Status: DC | PRN

## 2014-12-23 MED ORDER — ZOLPIDEM TARTRATE 5 MG PO TABS: 5.0000 mg | ORAL_TABLET | Freq: Every evening | ORAL | Status: DC | PRN

## 2014-12-23 MED ORDER — DIPHENHYDRAMINE HCL 25 MG PO CAPS: 25.0000 mg | ORAL_CAPSULE | Freq: Four times a day (QID) | ORAL | Status: DC | PRN

## 2014-12-23 MED ORDER — BENZOCAINE-MENTHOL 20-0.5 % EX AERO
1.0000 "application " | INHALATION_SPRAY | CUTANEOUS | Status: DC | PRN
  Administered 2014-12-23 – 2014-12-24 (×2): 1 via TOPICAL
  Filled 2014-12-23 (×3): qty 56

## 2014-12-23 MED ORDER — OXYCODONE-ACETAMINOPHEN 5-325 MG PO TABS
1.0000 | ORAL_TABLET | ORAL | Status: DC | PRN
  Administered 2014-12-24 – 2014-12-25 (×4): 1 via ORAL
  Filled 2014-12-23: qty 1

## 2014-12-23 MED ORDER — PRENATAL MULTIVITAMIN CH
1.0000 | ORAL_TABLET | Freq: Every day | ORAL | Status: DC
  Administered 2014-12-24 – 2014-12-25 (×2): 1 via ORAL
  Filled 2014-12-23 (×2): qty 1

## 2014-12-23 MED ORDER — SIMETHICONE 80 MG PO CHEW
80.0000 mg | CHEWABLE_TABLET | ORAL | Status: DC | PRN
Start: 2014-12-23 — End: 2014-12-25

## 2014-12-23 MED ORDER — FENTANYL 2.5 MCG/ML BUPIVACAINE 1/10 % EPIDURAL INFUSION (WH - ANES)
INTRAMUSCULAR | Status: DC | PRN
  Administered 2014-12-23: 14 mL/h via EPIDURAL

## 2014-12-23 MED ORDER — TETANUS-DIPHTH-ACELL PERTUSSIS 5-2.5-18.5 LF-MCG/0.5 IM SUSP: 0.5000 mL | Freq: Once | INTRAMUSCULAR | Status: DC

## 2014-12-23 MED ORDER — LANOLIN HYDROUS EX OINT: TOPICAL_OINTMENT | CUTANEOUS | Status: DC | PRN

## 2014-12-23 MED ORDER — SENNOSIDES-DOCUSATE SODIUM 8.6-50 MG PO TABS
2.0000 | ORAL_TABLET | ORAL | Status: DC
  Administered 2014-12-24 (×2): 2 via ORAL
  Filled 2014-12-23 (×2): qty 2

## 2014-12-23 MED ORDER — ONDANSETRON HCL 4 MG PO TABS: 4.0000 mg | ORAL_TABLET | ORAL | Status: DC | PRN

## 2014-12-23 NOTE — Progress Notes (Signed)
Addendum Rectal pressure the same, VE deferred

## 2014-12-23 NOTE — Progress Notes (Signed)
  Subjective: Comfortable with epidural--family at bedside.  Feeling some pressure in vagina, but has not increased since last exam.    Objective: BP 126/79 mmHg  Pulse 95  Temp(Src) 98.4 F (36.9 C) (Oral)  Resp 18  Ht 5\' 5"  (1.651 m)  Wt 203 lb (92.08 kg)  BMI 33.78 kg/m2  SpO2 100% I/O last 3 completed shifts: In: -  Out: 850 [Urine:850]    FHT: Category 1 UC:   irregular, every 2 minutes SVE:   Dilation: 9 Effacement (%): 100 Station: 0 Exam by:: Venus Standard, CNM at [employer] Pitocin at 8 mu/min   Assessment:  Progressive labor GBS negative Abnormal AFP Hypothyroidism--on Synthyroid  Plan: Continue to observe at present for increased pressure. Positioned on peanut ball.  Donnel Saxon CNM 12/23/2014, 7:29 AM

## 2014-12-23 NOTE — Progress Notes (Signed)
  Subjective: Feeling same pressure in vagina.  Objective: BP 120/77 mmHg  Pulse 89  Temp(Src) 98.2 F (36.8 C) (Oral)  Resp 18  Ht 5\' 5"  (1.651 m)  Wt 203 lb (92.08 kg)  BMI 33.78 kg/m2  SpO2 100% I/O last 3 completed shifts: In: -  Out: 850 [Urine:850]    FHT: Category 1 UC:   regular, every 3-4 minutes SVE:   Right sided rim, vtx 0 station Slightly tight outlet Pitocin on 8 mu/min  Assessment:  Progressive labor  Plan: Position to facilitate rotation/descent. Continue pitocin augmentation.  Donnel Saxon CNM 12/23/2014, 8:27 AM

## 2014-12-23 NOTE — Anesthesia Procedure Notes (Signed)
Epidural Patient location during procedure: OB Start time: 12/23/2014 3:05 AM  Staffing Anesthesiologist: Milana Obey Performed by: anesthesiologist   Preanesthetic Checklist Completed: patient identified, site marked, surgical consent, pre-op evaluation, timeout performed, IV checked, risks and benefits discussed and monitors and equipment checked  Epidural Patient position: sitting Prep: site prepped and draped and DuraPrep Patient monitoring: continuous pulse ox and blood pressure Approach: midline Location: L3-L4 Injection technique: LOR saline  Needle:  Needle type: Tuohy  Needle gauge: 17 G Needle length: 9 cm and 9 Needle insertion depth: 6 cm Catheter type: closed end flexible Catheter size: 19 Gauge Catheter at skin depth: 10 cm Test dose: negative  Assessment Events: blood not aspirated, injection not painful, no injection resistance, negative IV test and no paresthesia  Additional Notes Patient identified. Risks/Benefits/Options discussed with patient including but not limited to bleeding, infection, nerve damage, paralysis, failed block, incomplete pain control, headache, blood pressure changes, nausea, vomiting, reactions to medication both or allergic, itching and postpartum back pain. Confirmed with bedside nurse the patient's most recent platelet count. Confirmed with patient that they are not currently taking any anticoagulation, have any bleeding history or any family history of bleeding disorders. Patient expressed understanding and wished to proceed. All questions were answered. Sterile technique was used throughout the entire procedure. Please see nursing notes for vital signs. Test dose was given through epidural catheter and negative prior to continuing to dose epidural or start infusion. Warning signs of high block given to the patient including shortness of breath, tingling/numbness in hands, complete motor block, or any concerning symptoms with  instructions to call for help. Patient was given instructions on fall risk and not to get out of bed. All questions and concerns addressed with instructions to call with any issues or inadequate analgesia.

## 2014-12-23 NOTE — Progress Notes (Signed)
Labor Progress  Subjective: Starting to feel ctx, requested IV pain medicine   Objective: BP 120/68 mmHg  Pulse 73  Temp(Src) 98.4 F (36.9 C) (Oral)  Resp 18  Ht 5\' 5"  (1.651 m)  Wt 203 lb (92.08 kg)  BMI 33.78 kg/m2     FHT: 130, moderate variability, + accel, no decel CTX:  irregular, every 2-8 minutes Uterus gravid, soft non tender SVE:  Dilation: 2 Effacement (%): 60 Station: -2 Exam by:: V. Dillinger Aston   Assessment:  IUP at 36.4 weeks NICHD: Category 1 Membranes:  SROM x 40.5hrs, no s/s of infection Labor progress: augmenatation GBS: negative    Plan: Continue labor plan Continuous/intermittent monitoring Rest Frequent position changes to facilitate fetal rotation and descent. Will reassess with cervical exam at 0400 or earlier if necessary Start pitocin per protocol      Fisher Hargadon, CNM, MSN 12/23/2014. 12:33 AM

## 2014-12-23 NOTE — Progress Notes (Signed)
Labor Progress  Subjective: Pt called out c/o increase rectal pressure  Objective: BP 116/73 mmHg  Pulse 95  Temp(Src) 98.4 F (36.9 C) (Oral)  Resp 18  Ht 5\' 5"  (1.651 m)  Wt 203 lb (92.08 kg)  BMI 33.78 kg/m2  SpO2 100%   Total I/O In: -  Out: 850 [Urine:850] FHT: 125, + acce, no decel, moderate variability CTX:  regular, every 3-4 minutes Uterus gravid, soft non tender SVE:  Dilation: 9 Effacement (%): 100 Station: 0 Exam by:: Klynn Linnemann, CNM Pitocin at 29mUn/min  Assessment:  IUP at 36.4 weeks NICHD: Category 1 Membranes:  SROM x 44hrs, no s/s of infection Labor progress:transition Pitocin Augmentation [id number]   Plan: Continue labor plan Continuous monitoring Frequent position changes to facilitate fetal rotation and descent. Continue pitocin per protocol     Azoria Abbett, CNM, MSN 12/23/2014. 6:00 AM

## 2014-12-23 NOTE — Progress Notes (Signed)
Labor Progress  Subjective: Comfortable with an epidural.    Objective: BP 122/77 mmHg  Pulse 77  Temp(Src) 98.4 F (36.9 C) (Oral)  Resp 18  Ht 5\' 5"  (1.651 m)  Wt 203 lb (92.08 kg)  BMI 33.78 kg/m2  SpO2 100%     FHT: 135 moderate variability, + accel, no decel CTX:  regular, every 2-3 minutes Uterus gravid, soft non tender SVE:  4/80/-1 Pitocin at 64mUn/min  Assessment:  IUP at 36.4 weeks NICHD: Category 1 Membranes:  SROM x 43.5hrs, no s/s of infection Labor progress: adquate labor Pitocin Augmentation GBS: negative   Plan: Continue labor plan Continuous monitoring Rest Frequent position changes to facilitate fetal rotation and descent. Will reassess with cervical exam at 0630 or earlier if necessary Continue pitocin per protocol      Cap Massi, CNM, MSN 12/23/2014. 4:32 AM

## 2014-12-23 NOTE — Anesthesia Preprocedure Evaluation (Signed)
Anesthesia Evaluation  Patient identified by MRN, date of birth, ID band Patient awake    Reviewed: Allergy & Precautions, NPO status , Patient's Chart, lab work & pertinent test results  History of Anesthesia Complications Negative for: history of anesthetic complications  Airway Mallampati: II  TM Distance: >3 FB Neck ROM: Full    Dental no notable dental hx.    Pulmonary neg pulmonary ROS,  breath sounds clear to auscultation  Pulmonary exam normal       Cardiovascular negative cardio ROS  Rhythm:Regular Rate:Normal     Neuro/Psych PSYCHIATRIC DISORDERS Anxiety Depression negative neurological ROS     GI/Hepatic negative GI ROS, Neg liver ROS,   Endo/Other  Hypothyroidism   Renal/GU negative Renal ROS  negative genitourinary   Musculoskeletal negative musculoskeletal ROS (+)   Abdominal   Peds negative pediatric ROS (+)  Hematology negative hematology ROS (+)   Anesthesia Other Findings   Reproductive/Obstetrics (+) Pregnancy                             Anesthesia Physical Anesthesia Plan  ASA: II  Anesthesia Plan: Epidural   Post-op Pain Management:    Induction:   Airway Management Planned:   Additional Equipment:   Intra-op Plan:   Post-operative Plan:   Informed Consent: I have reviewed the patients History and Physical, chart, labs and discussed the procedure including the risks, benefits and alternatives for the proposed anesthesia with the patient or authorized representative who has indicated his/her understanding and acceptance.   Dental advisory given  Plan Discussed with:   Anesthesia Plan Comments:         Anesthesia Quick Evaluation

## 2014-12-23 NOTE — Lactation Note (Signed)
This note was copied from the chart of [place]. Lactation Consultation Note  Patient Name: Alicia Stokes [id number] Date: 12/23/2014 Reason for consult: Initial assessment;Late preterm infant;Other (Comment) (baby weighs >7 lbs ) Mom is a primipara and her baby weighs >7 lbs but was born at borderline pre-term (LPI) at 36 weeks 4 days.  LC assisted mom to hand express some gtts of colostrum and demonstrated cross-cradle position after gently stimulating baby to awaken.  He spits a little foamy mucus during attempt and is too sleepy to latch.  LC reviewed benefits of STS and cue feedings, as well as special considerations for LPI baby, including handout.  LC did not observe any lethargy or tremors and baby is pink and feels warm to touch.  Baby remains STS with mom, in upright position against her chest.   Maternal Data Formula Feeding for Exclusion: No Has patient been taught Hand Expression?: Yes (LC demonstrated and drops of colostrum obtained prior to latch attempt) Does the patient have breastfeeding experience prior to this delivery?: No  Feeding Feeding Type: Breast Fed  LATCH Score/Interventions Latch: Too sleepy or reluctant, no latch achieved, no sucking elicited. Intervention(s): Skin to skin;Teach feeding cues;Waking techniques  Audible Swallowing: None Intervention(s): Skin to skin;Hand expression Intervention(s): Skin to skin;Hand expression  Type of Nipple: Everted at rest and after stimulation  Comfort (Breast/Nipple): Soft / non-tender     Hold (Positioning): Assistance needed to correctly position infant at breast and maintain latch. Intervention(s): Breastfeeding basics reviewed;Support Pillows;Position options;Skin to skin (assisted mom with cross-cradle; suggested later trying football position)  [facility] Score: 5 (LC assisted and observed - no latch achieved)  Lactation Tools Discussed/Used    STS, cue feedings, hand expression LPI  Handout DEBP  -may need if baby too sleepy to feed  Consult Status Consult Status: Follow-up Date: 12/24/14 Follow-up type: In-patient    Junious Dresser [facility] 12/23/2014, 7:09 PM

## 2014-12-23 NOTE — Progress Notes (Signed)
Delivery of live viable female by Cira Servant, [place]. NICU at bedside for delivery. APGARS 8,9

## 2014-12-23 NOTE — Progress Notes (Signed)
  Subjective: Feeling more pressure, tearful.  Objective: BP 132/79 mmHg  Pulse 92  Temp(Src) 98 F (36.7 C) (Oral)  Resp 18  Ht 5\' 5"  (1.651 m)  Wt 203 lb (92.08 kg)  BMI 33.78 kg/m2  SpO2 100% I/O last 3 completed shifts: In: -  Out: 850 [Urine:850]    FHT: Category 1 UC:   regular, every 2-3 minutes SVE:   Dilation: 10 Effacement (%): 100 Station: +1 Exam by:: Cira Servant, CNM Pitocin at 10 mu/min   Assessment:  2nd stage labor, with urge to push  Plan: Initiate active pushing  Alicia Stokes, [place] 12/23/2014, 10:31 AM

## 2014-12-24 LAB — CBC
HEMATOCRIT: 32.9 % — AB (ref 36.0–46.0)
HEMOGLOBIN: 11.1 g/dL — AB (ref 12.0–15.0)
MCH: 29.4 pg (ref 26.0–34.0)
MCHC: 33.7 g/dL (ref 30.0–36.0)
MCV: 87 fL (ref 78.0–100.0)
Platelets: 149 10*3/uL — ABNORMAL LOW (ref 150–400)
RBC: 3.78 MIL/uL — AB (ref 3.87–5.11)
RDW: 13 % (ref 11.5–15.5)
WBC: 19.9 10*3/uL — ABNORMAL HIGH (ref 4.0–10.5)

## 2014-12-24 NOTE — Progress Notes (Signed)
MOB was referred for history of depression/anxiety.  Referral is screened out by Clinical Social Worker because none of the following criteria appear to apply: -History of anxiety/depression during this pregnancy, or of post-partum depression. - Diagnosis of anxiety and/or depression within last 3 years - History of depression due to pregnancy loss/loss of child or -MOB's symptoms are currently being treated with medication and/or therapy.  CSW completed chart review and noted that MOB was diagnosed with depression/anxiety in 2011. No documentation of acute symptoms during pregnancy.   Please contact the Clinical Social Worker if needs arise or upon MOB request.

## 2014-12-24 NOTE — Lactation Note (Signed)
This note was copied from the chart of [place]. Lactation Consultation Note F/u visit, mom states she is post-pumping after BF. Not really getting anything out. Asked about hand expression. States she isn't very good at it. Demonstrated breast massage and hand expression, noted drops to end of nipple. Noted short shaft nipple, but very compressible areola for deep latch. Showed picture in book for deep latch and shallow latch on end of nipple. Denies tenderness or painful latches. Shells given to assist in everting nipple more. Encouraged to roll nipple in finger tips prior to latching. Encouraged using good props for positioning.  Mom suing DEBP after BF d/t LPI. Noted Hx: of PCOS and hypothyroidism as well. Spoke w/mom importance of stimulation of breast. Patient Name: Alicia Stokes [id number] Date: 12/24/2014 Reason for consult: Follow-up assessment   Maternal Data    Feeding Feeding Type: Breast Fed Length of feed: 15 min  LATCH Score/Interventions       Intervention(s): Shells;Double electric pump  Comfort (Breast/Nipple): Soft / non-tender  Interventions (Mild/moderate discomfort): Hand massage;Hand expression;Post-pump  Intervention(s): Breastfeeding basics reviewed;Support Pillows;Position options;Skin to skin     Lactation Tools Discussed/Used Pump Review: Setup, frequency, and cleaning;Milk Storage Initiated by:: RN Date initiated:: 12/23/14   Consult Status Consult Status: Follow-up Date: 12/25/14 Follow-up type: In-patient    Theodoro Kalata 12/24/2014, 5:45 PM

## 2014-12-24 NOTE — Anesthesia Postprocedure Evaluation (Signed)
  Anesthesia Post-op Note  Patient: Alicia Stokes  Procedure(s) Performed: * No procedures listed *  Patient Location: Mother/Baby  Anesthesia Type:Epidural  Level of Consciousness: awake  Airway and Oxygen Therapy: Patient Spontanous Breathing  Post-op Pain: mild  Post-op Assessment: Patient's Cardiovascular Status Stable and Respiratory Function Stable  Post-op Vital Signs: stable  Last Vitals:  Filed Vitals:   12/24/14 0500  BP: 114/58  Pulse: 70  Temp: 36.4 C  Resp: 18    Complications: No apparent anesthesia complications

## 2014-12-24 NOTE — Progress Notes (Signed)
Alicia Stokes  Post Partum Day 1:S/P SVD with 2nd degree laceration  Subjective: Patient up ad lib, denies syncope or dizziness. Reports consuming regular diet without issues and denies N/V. Denies issues with urination and reports bleeding is "heavy to me, but the nurses say that's normal."  Patient is breastfeeding and reports some difficulties.  Desires for postpartum contraception is currently unknown as this pregnancy was via IVF.  Pain is being appropriately managed with use of ibuprofen.  Objective: Filed Vitals:   12/23/14 1522 12/23/14 1618 12/23/14 2021 12/24/14 0500  BP: 122/70 126/60 118/63 114/58  Pulse: 79 84 86 70  Temp: 99.1 F (37.3 C) 98 F (36.7 C) 98.2 F (36.8 C) 97.6 F (36.4 C)  TempSrc: Oral Oral Oral Oral  Resp: 18 18 18 18   Height:      Weight:      SpO2:  98% 98%     Recent Labs  12/22/14 0700 12/24/14 0525  HGB 12.9 11.1*  HCT 38.2 32.9*    Physical Exam:  General: alert, cooperative and no distress Mood/Affect: Appropriate Lungs: clear to auscultation, no wheezes, rales or rhonchi, symmetric air entry.  Heart: normal rate and regular rhythm. Breast: Soft, Nipples Intact. Abdomen:  + bowel sounds, Soft, NT Uterine Fundus: firm at +1/U Lochia: appropriate Laceration: Not Assessed Skin: Warm Dry. DVT Evaluation: No evidence of DVT seen on physical exam. Calf/Ankle edema is present.  Assessment S/P Vaginal Delivery-Day Normal [employer] BreastFeeding  Plan: Lactation Consult Infant to receive circumcision today Plan for discharge tomorrow Continue current care Dr. Octavio Manns to be updated on patient status   Conner Muegge, Francetta Found, MSN, CNM 12/24/2014, 8:39 AM

## 2014-12-25 ENCOUNTER — Encounter (HOSPITAL_COMMUNITY): Payer: Self-pay | Admitting: Obstetrics and Gynecology

## 2014-12-25 MED ORDER — OXYCODONE-ACETAMINOPHEN 5-325 MG PO TABS: 1.0000 | ORAL_TABLET | ORAL | Status: DC | PRN

## 2014-12-25 MED ORDER — IBUPROFEN 600 MG PO TABS: 600.0000 mg | ORAL_TABLET | Freq: Four times a day (QID) | ORAL | Status: DC | PRN

## 2014-12-25 NOTE — Discharge Summary (Signed)
  Vaginal Delivery Discharge Summary  Alicia Stokes  DOB:    01-18-1981 MRN:    [id number] CSN:    [id number]  Date of admission:                  12/22/14  Date of discharge:                   12/25/14  Procedures this admission:   SVB, repair of 2nd degree laceration  Date of Delivery: 12/23/14  Newborn Data:  Live born female  Birth Weight: 7 lb 4 oz (3289 g) APGAR: 8, 9  Baby is patient due to poor feedings Name: Haze Boyden Circumcision Plan: Inpatient  History of Present Illness:  Ms. Alicia Stokes is a 34 y.o. female, G2P0111, who presents at [redacted]w[redacted]d weeks gestation. The patient has been followed at the [facility] and Gynecology division of [facility]. She was admitted rupture of membranes. Her pregnancy has been complicated by:  Patient Active Problem List   Diagnosis Date Noted  . Vaginal delivery 12/23/2014  . Preterm premature rupture of membranes (PPROM) with onset of labor within 24 hours of rupture in third trimester, antepartum 12/22/2014  . Abnormal MSAFP (maternal serum alpha-fetoprotein), elevated 09/18/2014  . Dysplasia of cervix, low grade (CIN 1) 02/16/2012  . 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  Progressed with po [employer] and pitocin, with GBS culture done on admission and negative.Marland Kitchen Utilized epidural for pain management.  Delivery was performed by Donnel Saxon, CNM, without complication. Patient and baby tolerated the procedure without difficulty, with deep 2nd degree laceration noted. Infant status was stable and remained in room with mother.  Mother and infant then had an uncomplicated postpartum course, with breast feeding going slowly. Mom's physical exam was WNL, and she was discharged home in stable condition on day 2--baby was going to remain a baby patient due to slow feedings. Contraception plan was undecided at the time of d/c.  She received  adequate benefit from po pain medications, using both Percocet and Motrin..   Feeding:  breast  Contraception:  Undecided at present  Discharge hemoglobin:  HEMOGLOBIN  Date Value Ref Range Status  12/24/2014 11.1* 12.0 - 15.0 g/dL Final   HCT  Date Value Ref Range Status  12/24/2014 32.9* 36.0 - 46.0 % Final    Discharge Physical Exam:   General: alert Lochia: appropriate Uterine Fundus: firm Incision: healing well DVT Evaluation: No evidence of DVT seen on physical exam. Negative Homan's sign.  Intrapartum Procedures: spontaneous vaginal delivery Postpartum Procedures: none Complications-Operative and Postpartum: 2nd degree perineal laceration  Discharge Diagnoses: Term Pregnancy-delivered  Discharge Information:  Activity:           pelvic rest Diet:                routine Medications: Ibuprofen and Percocet Condition:      stable Instructions:     Discharge to: home  Follow-up Information    Follow up with [facility] In 6 weeks.   Specialty:  Obstetrics and Gynecology   Why:  Call for any questions or concerns.   Contact information:   [address] [place] [address] [phone]       Antanette Richwine, [place] 12/25/2014 9:03 AM

## 2014-12-25 NOTE — Lactation Note (Signed)
This note was copied from the chart of [place]. Lactation Consultation Note  Mother latched baby in cross cradle hold.  Sucks and some swallows observed.    Reviewed massage to keep him active.  Baby breastfed for approx 23 min. Parents understand to that his feedings should take place within 30 min.   Parents will give baby supplement afterwards with finger feeding using curved tip syringe. Reviewed volume guidelines. Discussed w/ parents that tomorrow before discharge we will transition supplements to slow flow nipple. Mother will post pump while FOB give supplement.  She is pumping drops of colostrum at this time. Encouraged mother to keep pumping schedule with the exception of 1-2 times during the night to rest. Nipples are pink.  Discussed signs of yeast although it looks like early nipple damage. Mother has shells and comfort gels and grandmother will bring her nursing bra so she can wear gels. Parents very competent w/ feeding plan.  Will call if they need assistance later today.  Patient Name: Alicia Stokes [id number]'O Date: 12/25/2014 Reason for consult: Late preterm infant;Follow-up assessment   Maternal Data    Feeding Feeding Type: Breast Fed Length of feed: 23 min  LATCH Score/Interventions Latch: Grasps breast easily, tongue down, lips flanged, rhythmical sucking.  Audible Swallowing: A few with stimulation  Type of Nipple: Flat  Comfort (Breast/Nipple): Filling, red/small blisters or bruises, mild/mod discomfort  Problem noted: Mild/Moderate discomfort Interventions (Mild/moderate discomfort): Comfort gels;Hand expression;Pre-pump if needed  Hold (Positioning): No assistance needed to correctly position infant at breast.  LATCH Score: 7  Lactation Tools Discussed/Used     Consult Status Consult Status: Follow-up Date: 12/26/14 Follow-up type: In-patient    Vivianne Master [facility] 12/25/2014, 1:05 PM

## 2014-12-25 NOTE — Lactation Note (Signed)
This note was copied from the chart of [place]. Lactation Consultation Note  Baby is due to be circumcised this morning but have asked to wait until tomorrow to improve his eating plan. Parents are doing a good job following feeding plan. Mother has only been able to pump drops of colostrum.  Encouraged mother to hand express before and after pumping and pump after every feeding except 2 times during the night. Grandmother is going to get her a nursing bra so she can wear comfort gels for her pink sore nipples. Will call to view latch with next feeding.  Parents state he slips off. Parents attempted bf this morning but baby did not latch so gave him supplement.   Patient Name: Boy Ayden Apodaca [id number] Date: 12/25/2014 Reason for consult: Follow-up assessment   Maternal Data    Feeding    LATCH Score/Interventions                      Lactation Tools Discussed/Used     Consult Status Consult Status: Follow-up Date: 12/25/14 Follow-up type: In-patient    Vivianne Master [facility] 12/25/2014, 9:49 AM

## 2014-12-25 NOTE — Discharge Instructions (Signed)

## 2014-12-26 ENCOUNTER — Ambulatory Visit: Payer: Self-pay

## 2014-12-26 NOTE — Lactation Note (Signed)
This note was copied from the chart of [place]. Lactation Consultation Note Poor BF, has been sleepy today. Had circumcision, and was put on double photo therapy. Poor latching on breast.  Mom has positional stripes to Rt. Nipple. Small w/short shaft. Compressible areola, but not enough for newborn to obtain deep latch. Patient Name: Alicia Stokes [id number] Date: 12/26/2014 Reason for consult: Follow-up assessment;Hyperbilirubinemia;Infant weight loss   Maternal Data    Feeding Feeding Type: Formula  LATCH Score/Interventions Latch: Repeated attempts needed to sustain latch, nipple held in mouth throughout feeding, stimulation needed to elicit sucking reflex. Intervention(s): Waking techniques Intervention(s): Adjust position;Assist with latch;Breast massage;Breast compression  Audible Swallowing: A few with stimulation (syring fed in ns) Intervention(s): Hand expression Intervention(s): Alternate breast massage;Hand expression  Type of Nipple: Everted at rest and after stimulation (short shaft) Intervention(s): Shells;Double electric pump  Comfort (Breast/Nipple): Filling, red/small blisters or bruises, mild/mod discomfort  Problem noted: Mild/Moderate discomfort Interventions (Mild/moderate discomfort): Hand massage;Hand expression;Post-pump;Comfort gels;Breast shields  Hold (Positioning): Assistance needed to correctly position infant at breast and maintain latch. Intervention(s): Breastfeeding basics reviewed;Support Pillows;Position options;Skin to skin  LATCH Score: 6  Lactation Tools Discussed/Used Tools: Shells;Nipple Shields;Pump;Comfort gels Nipple shield size: 16 Shell Type: Inverted Breast pump type: Double-Electric Breast Pump   Consult Status Consult Status: Follow-up Date: 12/26/14 Follow-up type: In-patient    Marveen Donlon, [place] 12/26/2014, 4:21 AM

## 2014-12-31 ENCOUNTER — Ambulatory Visit (HOSPITAL_COMMUNITY): Admission: RE | Admit: 2014-12-31 | Payer: BLUE CROSS/BLUE SHIELD | Source: Ambulatory Visit

## 2014-12-31 ENCOUNTER — Ambulatory Visit (HOSPITAL_COMMUNITY)
Admission: RE | Admit: 2014-12-31 | Discharge: 2014-12-31 | Disposition: A | Discharge status: Home / Self Care | Payer: BLUE CROSS/BLUE SHIELD | Source: Ambulatory Visit | Attending: Obstetrics and Gynecology | Admitting: Obstetrics and Gynecology

## 2014-12-31 NOTE — Lactation Note (Signed)
Lactation Consult          Alicia Stokes, 48 days old, weight today 6-15.8, 3180 gms  Mother's reason for visit:  Late Preterm infant feeding assessment  Visit Type:  Mother here for initial feeding assessment Appointment Notes: Alicia Stokes is 55 days old. He was born at 36.4/7 weeks. Mother has been using a 20 mm nipple shield. She has a history of PCOS. Alicia Stokes was conceived from IVF. Mother had infertility treatment one year before IVF. She was on Estradial and progesterone patches for 14 weeks.  Alicia Stokes has been breast feeding on cue with at least 8-12 feedings in 24 hours. Mother is also supplementing infant after feeding with EBM/ formula.  Alicia Stokes transferred 12 ml from one breast and 16 from alternate breast . Observed latch with lips tucked in . Adjust upper lip and lower jaw for wider gape. Mother taught breast compression. Infant sustained latch for 25 mins on first breast and 10 mins on second. Father supplemented infant with 35 ml of EBM.    Consult:  Initial Lactation Consultant:  Darla Lesches  ________________________________________________________________________    ________________________________________________________________________  Mother's Name: Alicia Stokes Type of delivery:  Vaginal del Breastfeeding Experience:   Maternal Medical Conditions:  Thyroid, Polycystic ovarian syndrome, History post partum depression and Infertility Maternal Medications:  Prenatal Vits, Synthroid  ________________________________________________________________________  Breastfeeding History (Post Discharge)  Frequency of breastfeeding:  Every 2-3 hours Duration of feeding:  5-45 mins  Supplementation  Formula:  Volume 80ml Frequency:  3 hrs       Brand: Similac  Breastmilk:  Volume 30-67ml Frequency:  Every 3 hours  Method:  Bottle,   Pumping  Type of pump:  Medela pump in style Frequency:  6-7 times daily Volume:  30-70 ml  Infant Intake and Output  Assessment  Voids:  8 in 24 hrs.  Color:  Clear yellow Stools:  6 in 24 hrs.  Color:  Yellow  ________________________________________________________________________  Maternal Breast Assessment  Breast:  Full Nipple:  Erect Pain level:  0 Pain interventions:  Bra  _______________________________________________________________________ Feeding Assessment/Evaluation  Initial feeding assessment:  Infant's oral assessment:  WNL  Positioning:  Cross cradle Right breast  LATCH documentation:  Latch:  2 = Grasps breast easily, tongue down, lips flanged, rhythmical sucking.  Audible swallowing:  2 = Spontaneous and intermittent  Type of nipple:  2 = Everted at rest and after stimulation  Comfort (Breast/Nipple):  1 = Filling, red/small blisters or bruises, mild/mod discomfort  Hold (Positioning):  1 = Assistance needed to correctly position infant at breast and maintain latch  LATCH score:  8  Attached assessment:  Deep  Lips flanged:  No.  Lips untucked:  Yes.    Suck assessment:  Nutritive and Nonnutritive  Tools:  Nipple shield 20 mm Instructed on use and cleaning of tool:  Yes.    Pre-feed weight:  3170 g  (6-lb. 15.8 oz.) Post-feed weight:  3182 g (7 lb. 0.2oz.) Amount transferred:  12 ml   Infant's oral assessment:  WNL  Positioning:  Football Left breast  LATCH documentation:  Latch:  2 = Grasps breast easily, tongue down, lips flanged, rhythmical sucking.  Audible swallowing:  2 = Spontaneous and intermittent  Type of nipple:  2 = Everted at rest and after stimulation  Comfort (Breast/Nipple):  1 = Filling, red/small blisters or bruises, mild/mod discomfort  Hold (Positioning):  1 = Assistance needed to correctly position infant at breast and maintain latch  LATCH score:  8  Attached assessment:  Deep  Lips flanged:  Yes.    Lips untucked:  Yes.    Suck assessment:  Nutritive  Tools:  Nipple shield 20 mm Instructed on use and cleaning of tool:  Yes.     Pre-feed weight:  3182 g  (7 lb. 0.2 oz.) Post-feed weight: 3198 g ( 7 lb.0.8  oz.) Amount transferred:  16 ml    Total amount transferred: 28  ml Total supplement given:  35 ml  Advised mother to continue to feed [place] 8-12 times in 24 hours.  Mother to continue to use 20 mm nipple shield Proper application of shield reviewed Supplement with 45 ml of EBM/Formula after each feeding Mother to post pump at least 6 times for 20 mins Advised good breast massage and hand expression Reviewed pump use with mother Discussed use of supplement to increase milk supply Advised mother to nap frequently  Discussed S/S of PP Depression  Follow up in one week on March 22 at 1pm for feeding assessement

## 2015-01-07 ENCOUNTER — Ambulatory Visit (HOSPITAL_COMMUNITY): Admission: RE | Admit: 2015-01-07 | Payer: BLUE CROSS/BLUE SHIELD | Source: Ambulatory Visit

## 2015-05-02 IMAGING — US US OB DETAIL+14 WK
1 series · 12 of 28 positions shown · non-contrast
Comparison: none

[Series 1: us ob detail+14 wk · 0.07mm/px · 12 of 94 slices shown]
[im 4/94]
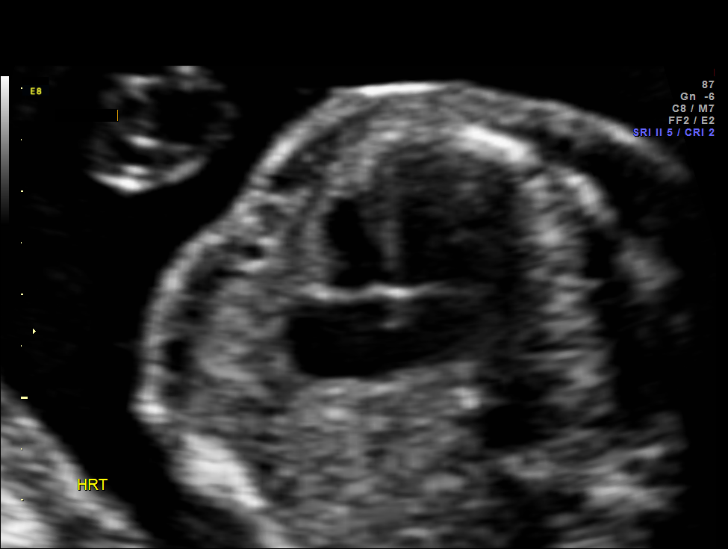
[im 11/94]
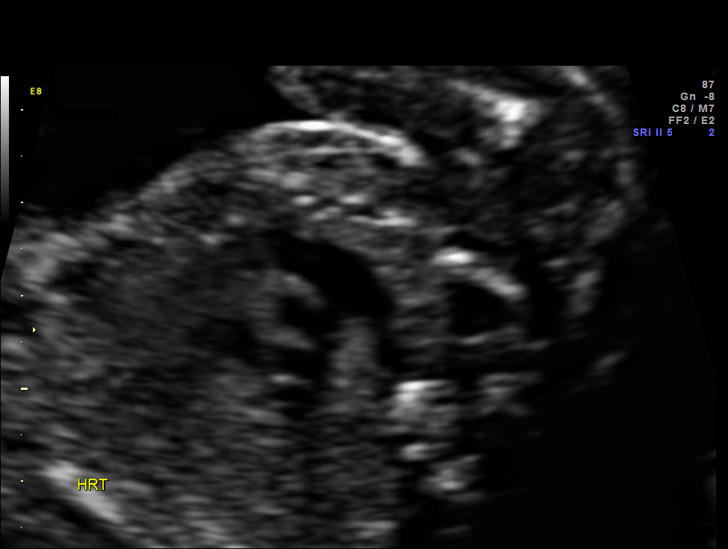
[im 18/94]
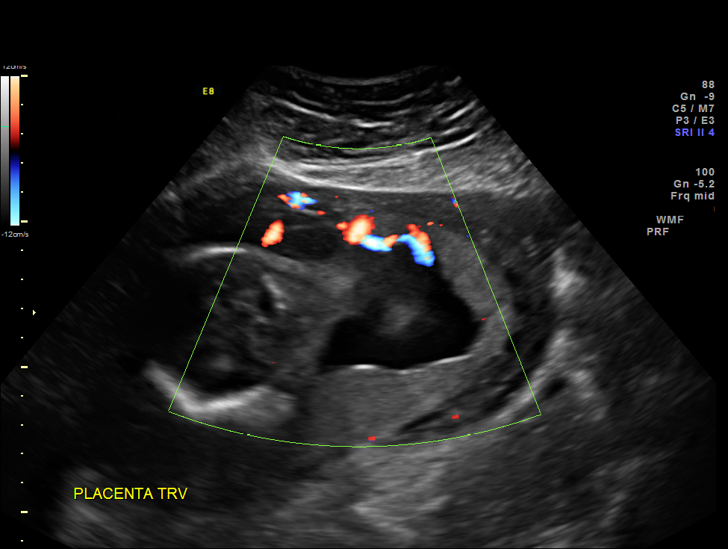
[im 28/94]
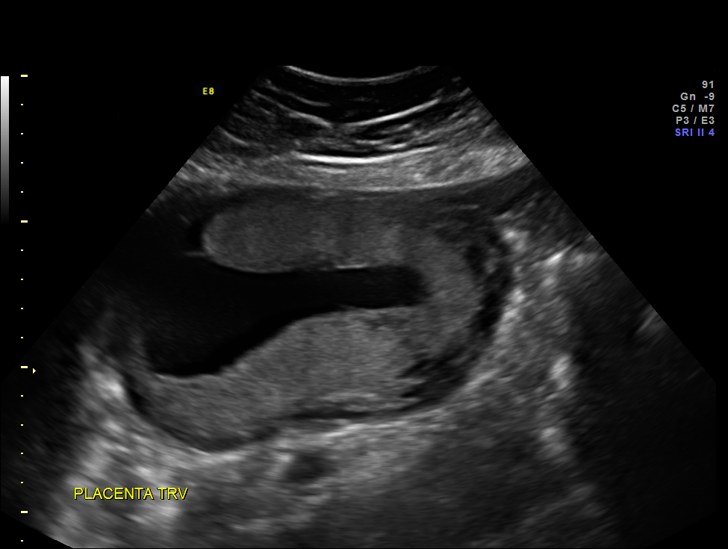
[im 35/94]
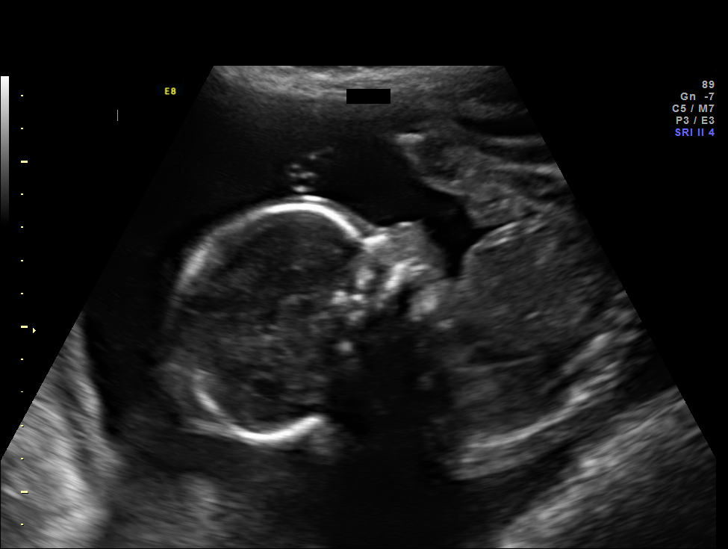
[im 42/94]
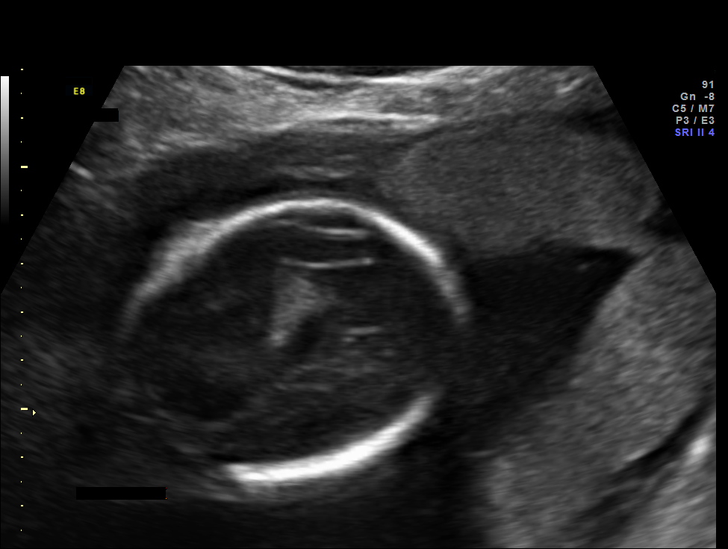
[im 52/94]
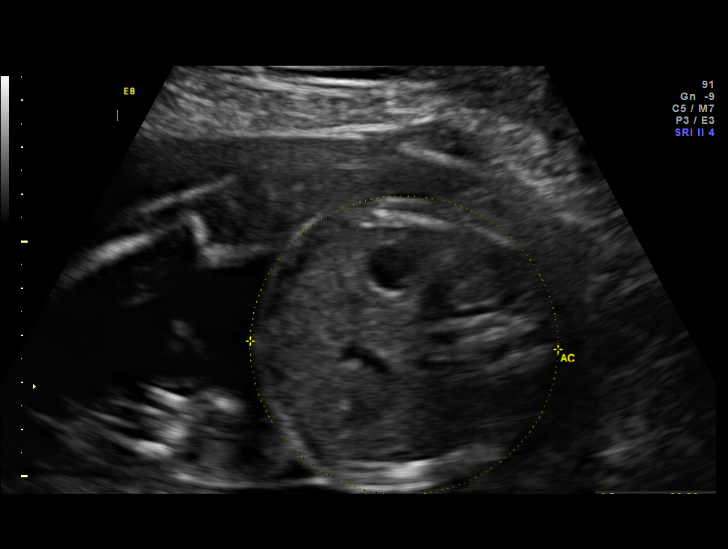
[im 59/94]
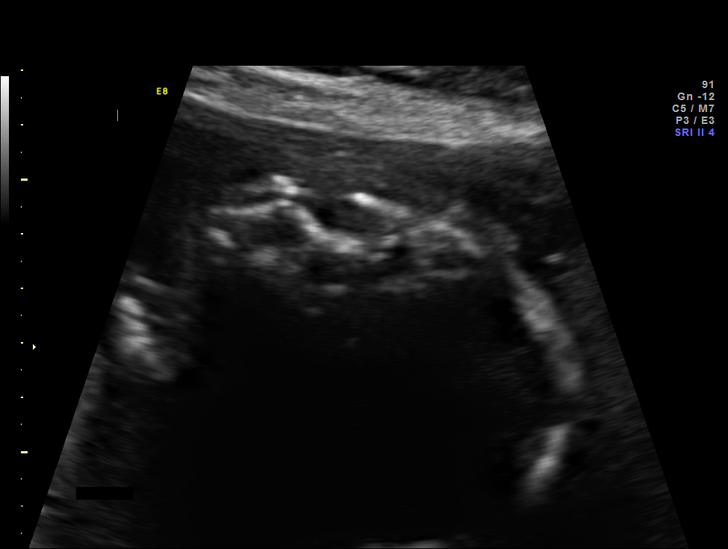
[im 66/94]
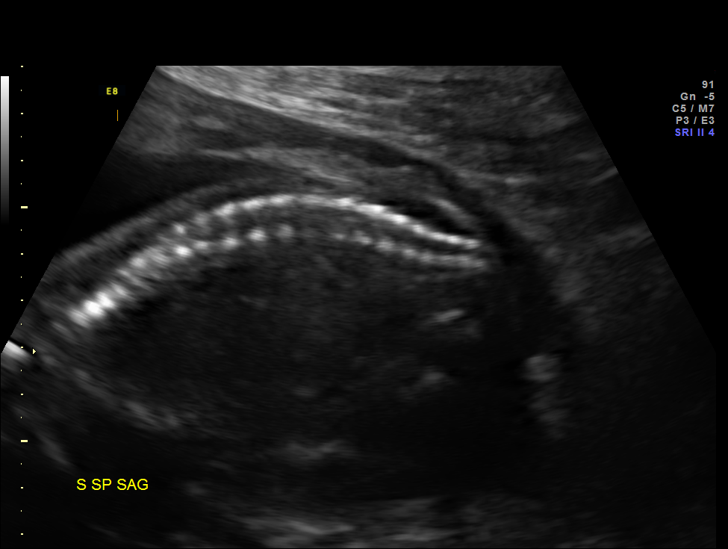
[im 76/94]
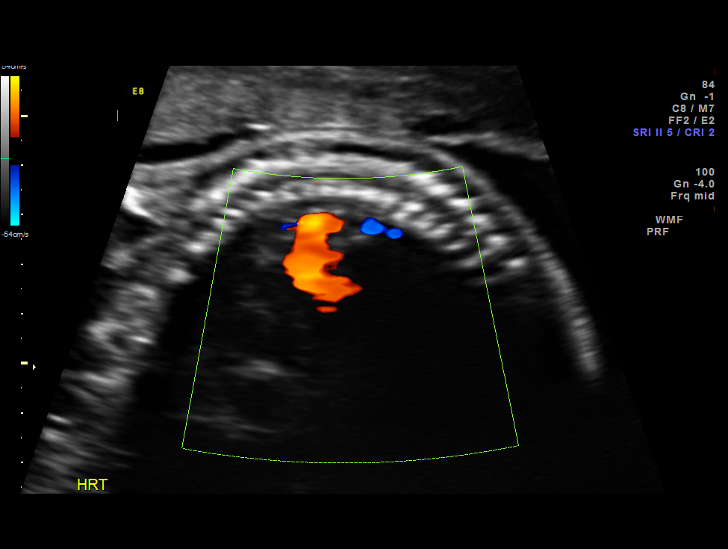
[im 83/94]
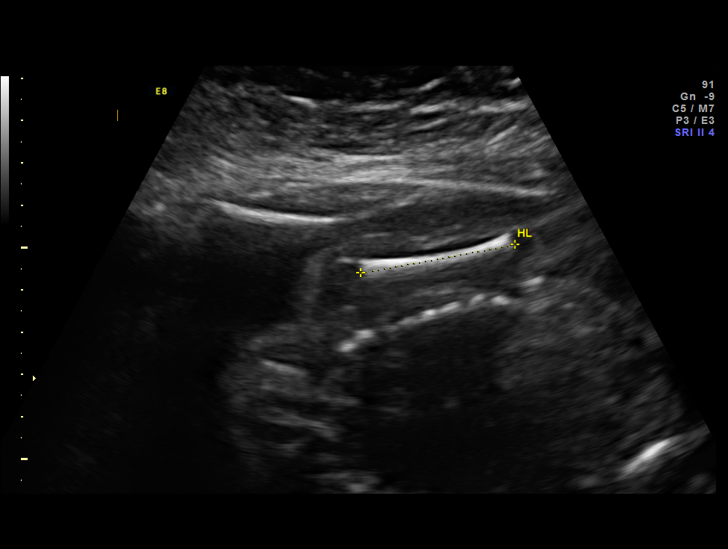
[im 90/94]
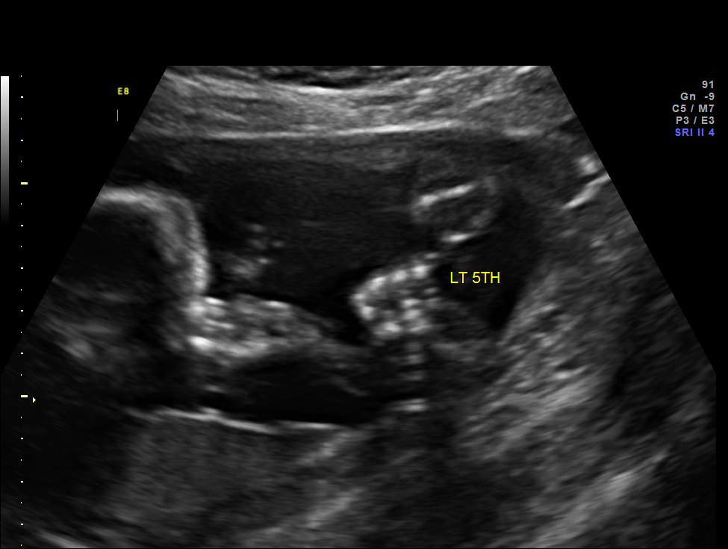

[12 of 28 positions shown; findings below may reference images not displayed]

OBSTETRICS REPORT
                      (Signed Final 09/18/2014 [DATE])

Service(s) Provided

 US OB DETAIL + 14 WK                                  76811.0
Indications

 Detailed fetal anatomic survey                        Z36
 Elevated MSAFP (3.21 MoM) - ONTD risk [DATE]
 Pregnancy resulting from assisted reproductive
 technology - IVF
 Hypothyroid (on synthroid)
 Polycystic ovarian syndrome
 22 weeks gestation of pregnancy
Fetal Evaluation

 Num Of Fetuses:    1
 Fetal Heart Rate:  148                          bpm
 Cardiac Activity:  Observed
 Presentation:      Frank breech
 Placenta:          Left lateral, above cervical
                    os
 P. Cord            See comments
 Insertion:

 Amniotic Fluid
 AFI FV:      Subjectively within normal limits
                                             Larg Pckt:     5.5  cm
Biometry

 BPD:       60  mm     G. Age:  24w 4d                CI:         78.0   70 - 86
 OFD:     76.9  mm                                    FL/HC:      18.0   19.2 -

 HC:     219.1  mm     G. Age:  23w 6d       78  %    HC/AC:      1.08   1.05 -

 AC:     202.7  mm     G. Age:  24w 6d       92  %    FL/BPD:     65.8   71 - 87
 FL:      39.5  mm     G. Age:  22w 5d       35  %    FL/AC:      19.5   20 - 24
 HUM:     37.3  mm     G. Age:  23w 0d       47  %
 CER:     26.4  mm     G. Age:  24w 1d       73  %

 Est. FW:     648  gm      1 lb 7 oz     70  %
Gestational Age

 U/S Today:     24w 0d                                        EDD:   01/08/15
 Best:          22w 6d     Det. By:  Early Ultrasound         EDD:   01/16/15
                                     (06/11/14)
Anatomy

 Cranium:          Appears normal         Aortic Arch:      Appears normal
 Fetal Cavum:      Appears normal         Ductal Arch:      Appears normal
 Ventricles:       Appears normal         Diaphragm:        Appears normal
 Choroid Plexus:   Appears normal         Stomach:          Appears normal, left
                                                            sided
 Cerebellum:       Appears normal         Abdomen:          Appears normal
 Posterior Fossa:  Appears normal         Abdominal Wall:   Appears nml (cord
                                                            insert, abd wall)
 Nuchal Fold:      Not applicable (>20    Cord Vessels:     Appears normal (3
                   wks GA)                                  vessel cord)
 Face:             Appears normal         Kidneys:          Appear normal
                   (orbits and profile)
 Lips:             Appears normal         Bladder:          Appears normal
 Heart:            Appears normal         Spine:            Appears normal
                   (4CH, axis, and
                   situs)
 RVOT:             Appears normal         Lower             Appears normal
                                          Extremities:
 LVOT:             Appears normal         Upper             Appears normal
                                          Extremities:

 Other:  Fetus appears to be a male. Heels and 5th digit visualized. Nasal
         bone visualized.
Targeted Anatomy

 Fetal Central Nervous System
 Cisterna Magna:
Cervix Uterus Adnexa

 Cervical Length:    3.7      cm

 Cervix:       Normal appearance by transabdominal scan.

 Adnexa:     No abnormality visualized.
Impression

 SIUP at 22+6 weeks
 Normal detailed fetal anatomy
 Normal amniotic fluid volume
 Measurements consistent with early US
 Left lateral placenta - vessels course along membranes
 before inserting into placenta

 Fortunately, no structural fetal abnormalities were identified to
 explain the elevated MSAFP. Specifically, the spine, posterior
 fossa and abdominal wall were seen and appeared normal.
 Ms. Bodrul received genetic counseling and decided not to
 pursue further testing.
Recommendations

 Please see genetic counseling note
 Serial growth Tzul in the third trimester (unexplained elevated
 MSAFP)

## 2016-02-05 MED FILL — LEVOTHYROXINE 88 MCG TABLET: 88 | 30 days supply | Qty: 30 | Fill #0

## 2016-02-12 DIAGNOSIS — E039 Hypothyroidism, unspecified: Secondary | ICD-10-CM | POA: Diagnosis not present

## 2016-02-19 MED FILL — LEVOTHYROXINE 100 MCG TAB: 100 | 90 days supply | Qty: 90 | Fill #0

## 2016-04-27 DIAGNOSIS — E039 Hypothyroidism, unspecified: Secondary | ICD-10-CM | POA: Diagnosis not present

## 2016-05-20 MED FILL — LEVOTHYROXINE 100 MCG TAB: 100 | 90 days supply | Qty: 90 | Fill #0

## 2016-05-21 DIAGNOSIS — H52223 Regular astigmatism, bilateral: Secondary | ICD-10-CM | POA: Diagnosis not present

## 2016-05-21 DIAGNOSIS — H5213 Myopia, bilateral: Secondary | ICD-10-CM | POA: Diagnosis not present

## 2016-06-18 DIAGNOSIS — F419 Anxiety disorder, unspecified: Secondary | ICD-10-CM | POA: Diagnosis not present

## 2016-07-08 DIAGNOSIS — B349 Viral infection, unspecified: Secondary | ICD-10-CM | POA: Diagnosis not present

## 2016-07-08 DIAGNOSIS — J029 Acute pharyngitis, unspecified: Secondary | ICD-10-CM | POA: Diagnosis not present

## 2016-07-21 MED FILL — ESCITALOPRAM 10 MG TABLET: 10 | 30 days supply | Qty: 30 | Fill #0

## 2016-08-23 MED FILL — LEVOTHYROXINE 100 MCG TAB: 100 | 90 days supply | Qty: 90 | Fill #1

## 2016-08-24 DIAGNOSIS — F419 Anxiety disorder, unspecified: Secondary | ICD-10-CM | POA: Diagnosis not present

## 2016-08-24 MED FILL — ESCITALOPRAM 10 MG TABLET: 10 | 90 days supply | Qty: 90 | Fill #0

## 2016-10-05 DIAGNOSIS — Z3183 Encounter for assisted reproductive fertility procedure cycle: Secondary | ICD-10-CM | POA: Diagnosis not present

## 2016-11-26 MED FILL — LEVOTHYROXINE 100 MCG TABLE: 100 | 90 days supply | Qty: 90 | Fill #2

## 2016-11-26 MED FILL — ESCITALOPRAM 10 MG TABLET: 10 | 90 days supply | Qty: 90 | Fill #1

## 2017-02-08 DIAGNOSIS — E039 Hypothyroidism, unspecified: Secondary | ICD-10-CM | POA: Diagnosis not present

## 2017-02-15 DIAGNOSIS — E039 Hypothyroidism, unspecified: Secondary | ICD-10-CM | POA: Diagnosis not present

## 2017-02-15 MED FILL — LEVOTHYROXINE 112 MCG TAB: 112 | 30 days supply | Qty: 30 | Fill #0

## 2017-03-01 MED FILL — ESCITALOPRAM 10 MG TABLET: 10 | 90 days supply | Qty: 90 | Fill #0

## 2017-03-22 MED FILL — LEVOTHYROXINE 112 MCG TAB: 112 | 30 days supply | Qty: 30 | Fill #1

## 2017-03-31 DIAGNOSIS — R1900 Intra-abdominal and pelvic swelling, mass and lump, unspecified site: Secondary | ICD-10-CM | POA: Diagnosis not present

## 2017-03-31 DIAGNOSIS — K219 Gastro-esophageal reflux disease without esophagitis: Secondary | ICD-10-CM | POA: Diagnosis not present

## 2017-03-31 DIAGNOSIS — F419 Anxiety disorder, unspecified: Secondary | ICD-10-CM | POA: Diagnosis not present

## 2017-03-31 DIAGNOSIS — Z13220 Encounter for screening for lipoid disorders: Secondary | ICD-10-CM | POA: Diagnosis not present

## 2017-03-31 DIAGNOSIS — Z131 Encounter for screening for diabetes mellitus: Secondary | ICD-10-CM | POA: Diagnosis not present

## 2017-03-31 DIAGNOSIS — E039 Hypothyroidism, unspecified: Secondary | ICD-10-CM | POA: Diagnosis not present

## 2017-04-01 ENCOUNTER — Other Ambulatory Visit: Payer: Self-pay | Admitting: Family Medicine

## 2017-04-01 DIAGNOSIS — R1900 Intra-abdominal and pelvic swelling, mass and lump, unspecified site: Secondary | ICD-10-CM

## 2017-04-11 DIAGNOSIS — E039 Hypothyroidism, unspecified: Secondary | ICD-10-CM | POA: Diagnosis not present

## 2017-05-02 MED FILL — LEVOTHYROXINE 112 MCG TAB: 112 | 30 days supply | Qty: 30 | Fill #2

## 2017-06-06 MED FILL — LEVOTHYROXINE 112 MCG TAB: 112 | 30 days supply | Qty: 30 | Fill #3

## 2017-06-10 DIAGNOSIS — H5213 Myopia, bilateral: Secondary | ICD-10-CM | POA: Diagnosis not present

## 2017-06-10 DIAGNOSIS — H52223 Regular astigmatism, bilateral: Secondary | ICD-10-CM | POA: Diagnosis not present

## 2017-06-14 MED FILL — ESCITALOPRAM 10 MG TABLET: 10 | 90 days supply | Qty: 90 | Fill #0

## 2017-07-05 ENCOUNTER — Ambulatory Visit
Admission: RE | Admit: 2017-07-05 | Discharge: 2017-07-05 | Disposition: A | Discharge status: Home / Self Care | Payer: BLUE CROSS/BLUE SHIELD | Source: Ambulatory Visit | Attending: Family Medicine | Admitting: Family Medicine

## 2017-07-05 DIAGNOSIS — N200 Calculus of kidney: Secondary | ICD-10-CM | POA: Diagnosis not present

## 2017-07-05 DIAGNOSIS — R1900 Intra-abdominal and pelvic swelling, mass and lump, unspecified site: Secondary | ICD-10-CM

## 2017-07-05 MED ORDER — IOPAMIDOL (ISOVUE-300) INJECTION 61%
100.0000 mL | Freq: Once | INTRAVENOUS | Status: AC | PRN
  Administered 2017-07-05: 100 mL via INTRAVENOUS

## 2017-07-06 MED FILL — LEVOTHYROXINE 112 MCG TAB: 112 | 30 days supply | Qty: 30 | Fill #4

## 2017-07-28 ENCOUNTER — Ambulatory Visit: Payer: Self-pay | Admitting: General Surgery

## 2017-07-28 DIAGNOSIS — R1902 Left upper quadrant abdominal swelling, mass and lump: Secondary | ICD-10-CM | POA: Diagnosis not present

## 2017-07-28 NOTE — H&P (Signed)
Alicia Stokes 07/28/2017 4:24 PM Location: [facility] Patient #: [id number] DOB: [birth year] Married / Language: [name] / Race: White Female  History of Present Illness Odis Hollingshead MD; 07/28/2017 4:45 PM) The patient is a 36 year old female.   Note:She is referred by Dr. Drema Dallas for consultation regarding at 3.3 cm abdominal wall mass. She noticed this about 2-3 months ago. There are times when it causes spasmodic-type pain. She thinks it may be getting a little larger. CT scan was done which demonstrated a 3.3 cm x 2.5 cm abdominal wall subcutaneous mass just to the left of the midline. By CT, the suspicion was for endometriosis. No evidence of hernia. No history of anything like this before. I have reviewed the CT scan. She works and has a 22-year-old child.  Past Surgical History Odis Hollingshead, MD; 07/28/2017 4:48 PM) Oral Surgery  Diagnostic Studies History Odis Hollingshead, MD; 07/28/2017 4:48 PM) Colonoscopy never Mammogram never Pap Smear 1-5 years ago  Allergies [facility], LPN; 27/25/3664 4:03 PM) No Known Drug Allergies 07/28/2017 Allergies Reconciled  Medication History (Hadelyn Massenburg, LPN; 47/42/5956 3:87 PM) Escitalopram Oxalate (10MG  Tablet, Oral) Active. Levothyroxine Sodium (112MCG Tablet, Oral) Active. Medications Reconciled  Social History Odis Hollingshead, MD; 07/28/2017 4:48 PM) Alcohol use Moderate alcohol use. Caffeine use Carbonated beverages, Coffee. No drug use Tobacco use Never smoker.  Family History Odis Hollingshead, MD; 07/28/2017 4:48 PM) Colon Polyps Mother. Depression Mother. Migraine Headache Mother. Thyroid problems Father.  Pregnancy / Birth History Odis Hollingshead, MD; 07/28/2017 4:48 PM) Age at menarche 41 years. Contraceptive History Oral contraceptives. Gravida 2 Length (months) of breastfeeding 3-6 Maternal age 36-35 Para 1 Regular  periods  Other Problems Odis Hollingshead, MD; 07/28/2017 4:48 PM) Anxiety Disorder Depression Gastroesophageal Reflux Disease Hemorrhoids Thyroid Disease     Review of Systems Odis Hollingshead MD; 07/28/2017 4:48 PM) General Not Present- Appetite Loss, Chills, Fatigue, Fever, Night Sweats, Weight Gain and Weight Loss. Skin Not Present- Change in Wart/Mole, Dryness, Hives, Jaundice, New Lesions, Non-Healing Wounds, Rash and Ulcer. HEENT Present- Seasonal Allergies and Wears glasses/contact lenses. Not Present- Earache, Hearing Loss, Hoarseness, Nose Bleed, Oral Ulcers, Ringing in the Ears, Sinus Pain, Sore Throat, Visual Disturbances and Yellow Eyes. Respiratory Not Present- Bloody sputum, Chronic Cough, Difficulty Breathing, Snoring and Wheezing. Breast Not Present- Breast Mass, Breast Pain, Nipple Discharge and Skin Changes. Cardiovascular Not Present- Chest Pain, Difficulty Breathing Lying Down, Leg Cramps, Palpitations, Rapid Heart Rate, Shortness of Breath and Swelling of Extremities. Gastrointestinal Present- Hemorrhoids. Not Present- Abdominal Pain, Bloating, Bloody Stool, Change in Bowel Habits, Chronic diarrhea, Constipation, Difficulty Swallowing, Excessive gas, Gets full quickly at meals, Indigestion, Nausea, Rectal Pain and Vomiting. Female Genitourinary Not Present- Frequency, Nocturia, Painful Urination, Pelvic Pain and Urgency. Musculoskeletal Not Present- Back Pain, Joint Pain, Joint Stiffness, Muscle Pain, Muscle Weakness and Swelling of Extremities. Neurological Not Present- Decreased Memory, Fainting, Headaches, Numbness, Seizures, Tingling, Tremor, Trouble walking and Weakness. Psychiatric Present- Anxiety, Change in Sleep Pattern and Depression. Not Present- Bipolar, Fearful and Frequent crying. Endocrine Not Present- Cold Intolerance, Excessive Hunger, Hair Changes, Heat Intolerance, Hot flashes and New Diabetes. Hematology Not Present- Blood Thinners, Easy  Bruising, Excessive bleeding, Gland problems, HIV and Persistent Infections.  Vitals (Hadelyn Massenburg LPN; 56/43/3295 1:88 PM) 07/28/2017 4:24 PM Weight: 178.5 lb Height: 65in Body Surface Area: 1.88 m Body Mass Index: 29.7 kg/m  Temp.: 97.42F  Pulse: 78 (Regular)  P.OX: 97% (Room air) BP: 124/84 (Sitting, Left  Arm, Standard)      Physical Exam Odis Hollingshead MD; 07/28/2017 4:48 PM)  The physical exam findings are as follows: Note:GENERAL APPEARANCE: WDWN in NAD. Pleasant and cooperative.  EARS, NOSE, MOUTH THROAT: [place]/AT external ears: no lesions or deformities external nose: no lesions or deformities hearing: grossly normal lips: moist, no deformities EYES external: conjunctiva, lids, sclerae normal pupils: equal, round glasses: no  CV ascultation: RRR, no murmur extremity edema: no   RESP/CHEST auscultation: breath sounds equal and clear respiratory effort: normal  GASTROINTESTINAL abdomen: Soft, non-tender, non-distended, 4.5 cm fixed abdominal wall mass just to the left of the midline in epigastric region. liver and spleen: not enlarged. hernia: none present scar: none present MUSCULOSKELETAL station and gait: normal deformities: none instability: none  SKIN jaundice: none subcutaneous nodules: see above  NEUROLOGIC speech: normal  PSYCHIATRIC alertness and orientation: normal mood/affect/behavior: normal judgement and insight: normal    Assessment & Plan Odis Hollingshead MD; 07/28/2017 4:44 PM)  ABDOMINAL WALL MASS OF LEFT UPPER QUADRANT (R19.02) Impression: This is into this intermittently symptomatic. It also appears to me to be larger today than it was on a CT scan measuring 4.5 cm. Etiology unknown. Does not look like a lipoma on CT scan.  Plan: I recommended excision of the mass under general anesthesia has an outpatient. The procedure and risks- including but not limited to bleeding, infection, wound healing  problems, anesthesia, recurrence, need for other operations. She seems to understand and would like to proceed.  Jackolyn Confer, M.D.

## 2017-08-04 MED FILL — LEVOTHYROXINE 112 MCG TAB: 112 | 30 days supply | Qty: 30 | Fill #5

## 2017-08-30 ENCOUNTER — Other Ambulatory Visit: Payer: Self-pay

## 2017-08-30 ENCOUNTER — Encounter (HOSPITAL_BASED_OUTPATIENT_CLINIC_OR_DEPARTMENT_OTHER): Payer: Self-pay | Admitting: *Deleted

## 2017-08-30 NOTE — Progress Notes (Addendum)
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   needs urine pregnancy.

## 2017-09-01 ENCOUNTER — Other Ambulatory Visit (HOSPITAL_COMMUNITY): Payer: 59

## 2017-09-01 ENCOUNTER — Encounter (HOSPITAL_COMMUNITY)
Admission: RE | Admit: 2017-09-01 | Discharge: 2017-09-01 | Disposition: A | Discharge status: Home / Self Care | Payer: 59 | Source: Ambulatory Visit | Attending: General Surgery | Admitting: General Surgery

## 2017-09-01 DIAGNOSIS — D481 Neoplasm of uncertain behavior of connective and other soft tissue: Secondary | ICD-10-CM | POA: Diagnosis not present

## 2017-09-01 DIAGNOSIS — E039 Hypothyroidism, unspecified: Secondary | ICD-10-CM | POA: Diagnosis not present

## 2017-09-01 DIAGNOSIS — Z79890 Hormone replacement therapy (postmenopausal): Secondary | ICD-10-CM | POA: Diagnosis not present

## 2017-09-01 DIAGNOSIS — F329 Major depressive disorder, single episode, unspecified: Secondary | ICD-10-CM | POA: Diagnosis not present

## 2017-09-01 DIAGNOSIS — Z79899 Other long term (current) drug therapy: Secondary | ICD-10-CM | POA: Diagnosis not present

## 2017-09-01 DIAGNOSIS — R1906 Epigastric swelling, mass or lump: Secondary | ICD-10-CM | POA: Diagnosis present

## 2017-09-01 DIAGNOSIS — K219 Gastro-esophageal reflux disease without esophagitis: Secondary | ICD-10-CM | POA: Diagnosis not present

## 2017-09-01 DIAGNOSIS — F419 Anxiety disorder, unspecified: Secondary | ICD-10-CM | POA: Diagnosis not present

## 2017-09-01 LAB — CBC WITH DIFFERENTIAL/PLATELET
BASOS ABS: 0.1 10*3/uL (ref 0.0–0.1)
BASOS PCT: 1 %
EOS PCT: 5 %
Eosinophils Absolute: 0.4 10*3/uL (ref 0.0–0.7)
HEMATOCRIT: 41.7 % (ref 36.0–46.0)
Hemoglobin: 14.1 g/dL (ref 12.0–15.0)
Lymphocytes Relative: 35 %
Lymphs Abs: 3.1 10*3/uL (ref 0.7–4.0)
MCH: 30.7 pg (ref 26.0–34.0)
MCHC: 33.8 g/dL (ref 30.0–36.0)
MCV: 90.8 fL (ref 78.0–100.0)
MONO ABS: 0.5 10*3/uL (ref 0.1–1.0)
Monocytes Relative: 6 %
NEUTROS ABS: 4.8 10*3/uL (ref 1.7–7.7)
Neutrophils Relative %: 53 %
PLATELETS: 225 10*3/uL (ref 150–400)
RBC: 4.59 MIL/uL (ref 3.87–5.11)
RDW: 12.3 % (ref 11.5–15.5)
WBC: 8.9 10*3/uL (ref 4.0–10.5)

## 2017-09-01 LAB — BASIC METABOLIC PANEL
ANION GAP: 5 (ref 5–15)
BUN: 15 mg/dL (ref 6–20)
CALCIUM: 9.3 mg/dL (ref 8.9–10.3)
CO2: 25 mmol/L (ref 22–32)
Chloride: 106 mmol/L (ref 101–111)
Creatinine, Ser: 0.89 mg/dL (ref 0.44–1.00)
GFR calc Af Amer: >60 mL/min (ref >60)
GLUCOSE: 90 mg/dL (ref 65–99)
Potassium: 4.4 mmol/L (ref 3.5–5.1)
SODIUM: 136 mmol/L (ref 135–145)

## 2017-09-02 ENCOUNTER — Other Ambulatory Visit: Payer: Self-pay

## 2017-09-02 ENCOUNTER — Encounter (HOSPITAL_BASED_OUTPATIENT_CLINIC_OR_DEPARTMENT_OTHER)
Admission: RE | Disposition: A | Discharge status: Home / Self Care | Payer: Self-pay | Source: Ambulatory Visit | Attending: General Surgery

## 2017-09-02 ENCOUNTER — Ambulatory Visit (HOSPITAL_BASED_OUTPATIENT_CLINIC_OR_DEPARTMENT_OTHER): Payer: 59 | Admitting: Anesthesiology

## 2017-09-02 ENCOUNTER — Ambulatory Visit (HOSPITAL_BASED_OUTPATIENT_CLINIC_OR_DEPARTMENT_OTHER)
Admission: RE | Admit: 2017-09-02 | Discharge: 2017-09-02 | Disposition: A | Discharge status: Home / Self Care | Payer: 59 | Source: Ambulatory Visit | Attending: General Surgery | Admitting: General Surgery

## 2017-09-02 ENCOUNTER — Encounter (HOSPITAL_BASED_OUTPATIENT_CLINIC_OR_DEPARTMENT_OTHER): Payer: Self-pay | Admitting: *Deleted

## 2017-09-02 DIAGNOSIS — E039 Hypothyroidism, unspecified: Secondary | ICD-10-CM | POA: Diagnosis not present

## 2017-09-02 DIAGNOSIS — Z79890 Hormone replacement therapy (postmenopausal): Secondary | ICD-10-CM | POA: Diagnosis not present

## 2017-09-02 DIAGNOSIS — F419 Anxiety disorder, unspecified: Secondary | ICD-10-CM | POA: Insufficient documentation

## 2017-09-02 DIAGNOSIS — D481 Neoplasm of uncertain behavior of connective and other soft tissue: Secondary | ICD-10-CM | POA: Diagnosis not present

## 2017-09-02 DIAGNOSIS — R222 Localized swelling, mass and lump, trunk: Secondary | ICD-10-CM | POA: Diagnosis not present

## 2017-09-02 DIAGNOSIS — Z79899 Other long term (current) drug therapy: Secondary | ICD-10-CM | POA: Insufficient documentation

## 2017-09-02 DIAGNOSIS — K219 Gastro-esophageal reflux disease without esophagitis: Secondary | ICD-10-CM | POA: Insufficient documentation

## 2017-09-02 DIAGNOSIS — F329 Major depressive disorder, single episode, unspecified: Secondary | ICD-10-CM | POA: Diagnosis not present

## 2017-09-02 HISTORY — DX: Personal history of other diseases of the female genital tract: Z87.42

## 2017-09-02 HISTORY — PX: MASS EXCISION: SHX2000

## 2017-09-02 HISTORY — DX: Gastro-esophageal reflux disease without esophagitis: K21.9

## 2017-09-02 HISTORY — DX: Localized swelling, mass and lump, trunk: R22.2

## 2017-09-02 LAB — POCT PREGNANCY, URINE: PREG TEST UR: NEGATIVE

## 2017-09-02 SURGERY — EXCISION MASS
Anesthesia: General | Site: Abdomen

## 2017-09-02 MED ORDER — SCOPOLAMINE 1 MG/3DAYS TD PT72
MEDICATED_PATCH | TRANSDERMAL | Status: DC | PRN
  Administered 2017-09-02: 1 via TRANSDERMAL

## 2017-09-02 MED ORDER — HYDROCODONE-ACETAMINOPHEN 5-325 MG PO TABS: 1.0000 | ORAL_TABLET | Freq: Four times a day (QID) | ORAL | 0 refills | Status: DC | PRN

## 2017-09-02 MED ORDER — SUCCINYLCHOLINE CHLORIDE 200 MG/10ML IV SOSY
PREFILLED_SYRINGE | INTRAVENOUS | Status: DC | PRN
Start: 2017-09-02 — End: 2017-09-02
  Administered 2017-09-02: 100 mg via INTRAVENOUS

## 2017-09-02 MED ORDER — MORPHINE SULFATE (PF) 2 MG/ML IV SOLN
2.0000 mg | INTRAVENOUS | Status: DC | PRN
  Filled 2017-09-02: qty 2

## 2017-09-02 MED ORDER — CEFAZOLIN SODIUM-DEXTROSE 2-4 GM/100ML-% IV SOLN
INTRAVENOUS | Status: AC
  Filled 2017-09-02: qty 100

## 2017-09-02 MED ORDER — PROPOFOL 10 MG/ML IV BOLUS
INTRAVENOUS | Status: DC | PRN
  Administered 2017-09-02: 50 mg via INTRAVENOUS
  Administered 2017-09-02: 150 mg via INTRAVENOUS

## 2017-09-02 MED ORDER — CEFAZOLIN SODIUM-DEXTROSE 2-4 GM/100ML-% IV SOLN
2.0000 g | INTRAVENOUS | Status: AC
  Administered 2017-09-02: 2 g via INTRAVENOUS
  Filled 2017-09-02: qty 100

## 2017-09-02 MED ORDER — LIDOCAINE 2% (20 MG/ML) 5 ML SYRINGE
INTRAMUSCULAR | Status: DC | PRN
  Administered 2017-09-02: 80 mg via INTRAVENOUS

## 2017-09-02 MED ORDER — KETOROLAC TROMETHAMINE 30 MG/ML IJ SOLN
INTRAMUSCULAR | Status: DC | PRN
  Administered 2017-09-02: 30 mg via INTRAVENOUS

## 2017-09-02 MED ORDER — BUPIVACAINE-EPINEPHRINE 0.5% -1:200000 IJ SOLN
INTRAMUSCULAR | Status: DC | PRN
  Administered 2017-09-02: 10 mL

## 2017-09-02 MED ORDER — DEXAMETHASONE SODIUM PHOSPHATE 10 MG/ML IJ SOLN
INTRAMUSCULAR | Status: DC | PRN
Start: 2017-09-02 — End: 2017-09-02
  Administered 2017-09-02: 10 mg via INTRAVENOUS

## 2017-09-02 MED ORDER — FENTANYL CITRATE (PF) 100 MCG/2ML IJ SOLN
INTRAMUSCULAR | Status: AC
  Filled 2017-09-02: qty 4

## 2017-09-02 MED ORDER — PROMETHAZINE HCL 25 MG/ML IJ SOLN
6.2500 mg | INTRAMUSCULAR | Status: DC | PRN
  Filled 2017-09-02: qty 1

## 2017-09-02 MED ORDER — SUGAMMADEX SODIUM 200 MG/2ML IV SOLN
INTRAVENOUS | Status: DC | PRN
  Administered 2017-09-02: 200 mg via INTRAVENOUS

## 2017-09-02 MED ORDER — SCOPOLAMINE 1 MG/3DAYS TD PT72
MEDICATED_PATCH | TRANSDERMAL | Status: AC
  Filled 2017-09-02: qty 1

## 2017-09-02 MED ORDER — MIDAZOLAM HCL 2 MG/2ML IJ SOLN
INTRAMUSCULAR | Status: AC
  Filled 2017-09-02: qty 2

## 2017-09-02 MED ORDER — FENTANYL CITRATE (PF) 100 MCG/2ML IJ SOLN
25.0000 ug | INTRAMUSCULAR | Status: DC | PRN
  Administered 2017-09-02: 25 ug via INTRAVENOUS
  Filled 2017-09-02: qty 1

## 2017-09-02 MED ORDER — FENTANYL CITRATE (PF) 100 MCG/2ML IJ SOLN
INTRAMUSCULAR | Status: DC | PRN
  Administered 2017-09-02: 100 ug via INTRAVENOUS

## 2017-09-02 MED ORDER — SUGAMMADEX SODIUM 200 MG/2ML IV SOLN
INTRAVENOUS | Status: AC
  Filled 2017-09-02: qty 2

## 2017-09-02 MED ORDER — ROCURONIUM BROMIDE 10 MG/ML (PF) SYRINGE
PREFILLED_SYRINGE | INTRAVENOUS | Status: DC | PRN
  Administered 2017-09-02: 30 mg via INTRAVENOUS

## 2017-09-02 MED ORDER — OXYCODONE HCL 5 MG PO TABS
5.0000 mg | ORAL_TABLET | ORAL | Status: DC | PRN
  Administered 2017-09-02: 5 mg via ORAL
  Filled 2017-09-02: qty 2

## 2017-09-02 MED ORDER — KETOROLAC TROMETHAMINE 30 MG/ML IJ SOLN
INTRAMUSCULAR | Status: AC
  Filled 2017-09-02: qty 1

## 2017-09-02 MED ORDER — LACTATED RINGERS IV SOLN
INTRAVENOUS | Status: DC
  Administered 2017-09-02: 09:00:00 via INTRAVENOUS
  Filled 2017-09-02: qty 1000

## 2017-09-02 MED ORDER — PROPOFOL 10 MG/ML IV BOLUS
INTRAVENOUS | Status: AC
  Filled 2017-09-02: qty 20

## 2017-09-02 MED ORDER — LIDOCAINE 2% (20 MG/ML) 5 ML SYRINGE
INTRAMUSCULAR | Status: AC
  Filled 2017-09-02: qty 5

## 2017-09-02 MED ORDER — ACETAMINOPHEN 325 MG PO TABS
650.0000 mg | ORAL_TABLET | ORAL | Status: DC | PRN
  Filled 2017-09-02: qty 2

## 2017-09-02 MED ORDER — MIDAZOLAM HCL 2 MG/2ML IJ SOLN
INTRAMUSCULAR | Status: DC | PRN
Start: 2017-09-02 — End: 2017-09-02
  Administered 2017-09-02: 2 mg via INTRAVENOUS

## 2017-09-02 MED ORDER — OXYCODONE HCL 5 MG PO TABS
ORAL_TABLET | ORAL | Status: AC
  Filled 2017-09-02: qty 1

## 2017-09-02 MED ORDER — FENTANYL CITRATE (PF) 100 MCG/2ML IJ SOLN
INTRAMUSCULAR | Status: AC
  Filled 2017-09-02: qty 2

## 2017-09-02 MED ORDER — METHOCARBAMOL 1000 MG/10ML IJ SOLN
500.0000 mg | Freq: Once | INTRAVENOUS | Status: AC
  Administered 2017-09-02: 500 mg via INTRAVENOUS
  Filled 2017-09-02: qty 5
  Filled 2017-09-02: qty 550

## 2017-09-02 MED ORDER — CHLORHEXIDINE GLUCONATE CLOTH 2 % EX PADS
6.0000 | MEDICATED_PAD | Freq: Once | CUTANEOUS | Status: DC
  Filled 2017-09-02: qty 6

## 2017-09-02 MED ORDER — ONDANSETRON HCL 4 MG/2ML IJ SOLN
INTRAMUSCULAR | Status: DC | PRN
  Administered 2017-09-02: 4 mg via INTRAVENOUS

## 2017-09-02 MED ORDER — BUPIVACAINE LIPOSOME 1.3 % IJ SUSP
INTRAMUSCULAR | Status: DC | PRN
  Administered 2017-09-02: 10 mL

## 2017-09-02 MED ORDER — DEXAMETHASONE SODIUM PHOSPHATE 10 MG/ML IJ SOLN
INTRAMUSCULAR | Status: AC
  Filled 2017-09-02: qty 1

## 2017-09-02 MED ORDER — ACETAMINOPHEN 650 MG RE SUPP
650.0000 mg | RECTAL | Status: DC | PRN
  Filled 2017-09-02: qty 1

## 2017-09-02 MED ORDER — SODIUM CHLORIDE 0.9% FLUSH
3.0000 mL | INTRAVENOUS | Status: DC | PRN
  Filled 2017-09-02: qty 3

## 2017-09-02 MED ORDER — ONDANSETRON HCL 4 MG/2ML IJ SOLN
INTRAMUSCULAR | Status: AC
  Filled 2017-09-02: qty 2

## 2017-09-02 MED ORDER — ARTIFICIAL TEARS OPHTHALMIC OINT
TOPICAL_OINTMENT | OPHTHALMIC | Status: AC
  Filled 2017-09-02: qty 3.5

## 2017-09-02 MED FILL — HYDROCODON-APAP 5-325: 5-325 | 3 days supply | Qty: 30 | Fill #0

## 2017-09-02 SURGICAL SUPPLY — 35 items
BENZOIN TINCTURE PRP APPL 2/3 (GAUZE/BANDAGES/DRESSINGS) ×3 IMPLANT
BLADE SURG 15 STRL LF DISP TIS (BLADE) ×1 IMPLANT
BLADE SURG 15 STRL SS (BLADE) ×2
CANISTER SUCT 1200ML W/VALVE (MISCELLANEOUS) ×3 IMPLANT
CHLORAPREP W/TINT 26ML (MISCELLANEOUS) ×3 IMPLANT
CLEANER CAUTERY TIP 5X5 PAD (MISCELLANEOUS) ×1 IMPLANT
CLOSURE WOUND 1/2 X4 (GAUZE/BANDAGES/DRESSINGS) ×1
COVER BACK TABLE 60X90IN (DRAPES) ×3 IMPLANT
COVER MAYO STAND STRL (DRAPES) ×3 IMPLANT
DRAPE LAPAROTOMY 100X72 PEDS (DRAPES) ×3 IMPLANT
DRSG TEGADERM 4X4.75 (GAUZE/BANDAGES/DRESSINGS) ×3 IMPLANT
ELECT REM PT RETURN 9FT ADLT (ELECTROSURGICAL) ×3
ELECTRODE REM PT RTRN 9FT ADLT (ELECTROSURGICAL) ×1 IMPLANT
GAUZE SPONGE 4X4 8PLY STR LF (GAUZE/BANDAGES/DRESSINGS) ×3 IMPLANT
GLOVE BIOGEL PI IND STRL 8.5 (GLOVE) ×1 IMPLANT
GLOVE BIOGEL PI INDICATOR 8.5 (GLOVE) ×2
GLOVE ECLIPSE 8.0 STRL XLNG CF (GLOVE) ×3 IMPLANT
GOWN STRL REUS W/ TWL LRG LVL3 (GOWN DISPOSABLE) ×2 IMPLANT
GOWN STRL REUS W/TWL LRG LVL3 (GOWN DISPOSABLE) ×4
NEEDLE HYPO 25X1 1.5 SAFETY (NEEDLE) ×3 IMPLANT
NS IRRIG 500ML POUR BTL (IV SOLUTION) ×3 IMPLANT
PACK BASIN DAY SURGERY FS (CUSTOM PROCEDURE TRAY) ×3 IMPLANT
PAD CLEANER CAUTERY TIP 5X5 (MISCELLANEOUS) ×2
PENCIL BUTTON HOLSTER BLD 10FT (ELECTRODE) ×3 IMPLANT
SPONGE LAP 4X18 X RAY DECT (DISPOSABLE) ×3 IMPLANT
STRIP CLOSURE SKIN 1/2X4 (GAUZE/BANDAGES/DRESSINGS) ×2 IMPLANT
SUT MNCRL AB 3-0 PS2 18 (SUTURE) ×3 IMPLANT
SUT PROLENE 2 0 CT2 30 (SUTURE) ×3 IMPLANT
SUT VIC AB 3-0 SH 27 (SUTURE) ×4
SUT VIC AB 3-0 SH 27X BRD (SUTURE) ×2 IMPLANT
SYR CONTROL 10ML LL (SYRINGE) ×3 IMPLANT
TOWEL OR 17X24 6PK STRL BLUE (TOWEL DISPOSABLE) ×6 IMPLANT
TUBE CONNECTING 12'X1/4 (SUCTIONS) ×1
TUBE CONNECTING 12X1/4 (SUCTIONS) ×2 IMPLANT
YANKAUER SUCT BULB TIP NO VENT (SUCTIONS) ×3 IMPLANT

## 2017-09-02 NOTE — Transfer of Care (Signed)
Immediate Anesthesia Transfer of Care Note  Patient: Alicia Stokes  Procedure(s) Performed: REMOVAL ABDOMINAL WALL MASS (N/A Abdomen)  Patient Location: PACU  Anesthesia Type:General  Level of Consciousness: awake, alert , oriented and patient cooperative  Airway & Oxygen Therapy: Patient Spontanous Breathing and Patient connected to nasal cannula oxygen  Post-op Assessment: Report given to RN and Post -op Vital signs reviewed and stable  Post vital signs: Reviewed and stable  Last Vitals:  Vitals:   09/02/17 0805 09/02/17 1133  BP: 111/77 118/71  Pulse: 66 77  Resp: 16 (!) 7  Temp: 36.9 C 36.8 C  SpO2: 99% 100%    Last Pain:  Vitals:   09/02/17 0805  TempSrc: Oral      Patients Stated Pain Goal: 7 (35/57/32 2025)  Complications: No apparent anesthesia complications

## 2017-09-02 NOTE — Op Note (Signed)
Operative Note  Alicia Stokes female 36 y.o. 09/02/2017  PREOPERATIVE DX:  Subcutaneous soft tissue mass of abdominal wall  POSTOPERATIVE DX:  Same  PROCEDURE:   Excision of 4.5 cm x 4.5 cm x 2.5 cm subcutaneous tissue mass of abdominal wall (to left of midline in epigastric region).         Surgeon: Odis Hollingshead   Assistants: None  Anesthesia: General endotracheal anesthesia  Indications:   This is a 36 year old female who has a fixed subcutaneous abdominal wall mass to the left the midline in the epigastric region. It is intermittent asymptomatic. CT scan demonstrates the mass. She now presents for removal.    Procedure Detail:  She was brought to the operating room laid supine on the operating table and a general anesthetic was given. The abdominal wall was widely sterilely prepped and draped. A timeout was performed.  The mass was identified. Local anesthetic was infiltrated in the skin and subcutaneous tissue over the mass. An incision was made to the left of the midline and the patient epigastric region over the mesh. Subcutaneous tissues were dissected with electrocautery. The mass was palpable and was very firm. Using electrocautery I was able to excise the mass, intact.  It did not violate the fascia. Once the mass was removed, it measured 4.5 cm x 4.5 cm x 2.5 cm. It was sent to pathology. A 2-0 Prolene suture was placed in the fascia at the deep margin of the mass. Exparel was then injected into the wound.  Once hemostasis was adequate, the subcutaneous tissues closed with interrupted 3-0 Vicryl sutures. The skin was closed with a running 3-0 Monocryl subcuticular stitch. Sterile strips and sterile dressing were applied.  She tolerated the procedure well without any apparent complications. She was taken to the recovery room in satisfactory condition.  Estimated Blood Loss:  less than 100 mL   Specimens: Subcutaneous abdominal wall mass        Complications:  *  No complications entered in OR log *         Disposition: PACU - hemodynamically stable.         Condition: stable

## 2017-09-02 NOTE — Anesthesia Postprocedure Evaluation (Signed)
Anesthesia Post Note  Patient: CORVETTE ORSER  Procedure(s) Performed: REMOVAL ABDOMINAL WALL MASS (N/A Abdomen)     Patient location during evaluation: PACU Anesthesia Type: General Level of consciousness: awake and alert Pain management: pain level controlled Vital Signs Assessment: post-procedure vital signs reviewed and stable Respiratory status: spontaneous breathing, nonlabored ventilation and respiratory function stable Cardiovascular status: blood pressure returned to baseline and stable Postop Assessment: no apparent nausea or vomiting Anesthetic complications: no    Last Vitals:  Vitals:   09/02/17 1200 09/02/17 1215  BP: 107/72 118/86  Pulse: (!) 59 71  Resp: 11 14  Temp:    SpO2: 93% 96%    Last Pain:  Vitals:   09/02/17 1300  TempSrc:   PainSc: 2                  Catalina Gravel

## 2017-09-02 NOTE — Anesthesia Procedure Notes (Signed)
Procedure Name: Intubation Date/Time: 09/02/2017 10:38 AM Performed by: Wanita Chamberlain, CRNA Pre-anesthesia Checklist: Patient identified, Emergency Drugs available, Suction available, Patient being monitored and Timeout performed Patient Re-evaluated:Patient Re-evaluated prior to induction Oxygen Delivery Method: Circle system utilized Preoxygenation: Pre-oxygenation with 100% oxygen Induction Type: IV induction Ventilation: Mask ventilation without difficulty Laryngoscope Size: Mac and 3 Grade View: Grade I Tube type: Oral Tube size: 7.0 mm Number of attempts: 1 Placement Confirmation: ETT inserted through vocal cords under direct vision,  positive ETCO2 and breath sounds checked- equal and bilateral Secured at: 21 cm Tube secured with: Tape Dental Injury: Teeth and Oropharynx as per pre-operative assessment

## 2017-09-02 NOTE — Discharge Instructions (Addendum)
No motrin, advil, aleve until 5 pm today  A ice to the area for the next 3 days.  Do not lie flat for the next 3 days.  Light activities until you are pain-free then resume normal activities as tolerated. This means no lifting over 10-15 pounds or straining.  May drive when you are pain-free and may return to work in 3-7 days when you are pain-free.  May take ibuprofen along with the Norco for pain.  Liquid diet today. Diet as tolerated tomorrow.  May shower. Remove bandage in 3 days. Leave Steri-Strips on wound and let them fall off by themselves.  Call for heavy bleeding, signs of infection, or other wound problems.  Call your surgeon if you experience:   1.  Fever over 101.0. 2.  Inability to urinate. 3.  Nausea and/or vomiting. 4.  Extreme swelling or bruising at the surgical site. 5.  Continued bleeding from the incision. 6.  Increased pain, redness or drainage from the incision. 7.  Problems related to your pain medication. 8.  Any problems and/or concerns   Information for Discharge Teaching: EXPAREL (bupivacaine liposome injectable suspension)   Your surgeon gave you EXPAREL(bupivacaine) in your surgical incision to help control your pain after surgery.   EXPAREL is a local anesthetic that provides pain relief by numbing the tissue around the surgical site.  EXPAREL is designed to release pain medication over time and can control pain for up to 72 hours.  Depending on how you respond to EXPAREL, you may require less pain medication during your recovery.  Possible side effects:  Temporary loss of sensation or ability to move in the area where bupivacaine was injected.  Nausea, vomiting, constipation  Rarely, numbness and tingling in your mouth or lips, lightheadedness, or anxiety may occur.  Call your doctor right away if you think you may be experiencing any of these sensations, or if you have other questions regarding possible side effects.  Follow all  other discharge instructions given to you by your surgeon or nurse. Eat a healthy diet and drink plenty of water or other fluids.  If you return to the hospital for any reason within 96 hours following the administration of EXPAREL, please inform your health care providers.  Post Anesthesia Home Care Instructions  Activity: Get plenty of rest for the remainder of the day. A responsible individual must stay with you for 24 hours following the procedure.  For the next 24 hours, DO NOT: -Drive a car -Paediatric nurse -Drink alcoholic beverages -Take any medication unless instructed by your physician -Make any legal decisions or sign important papers.  Meals: Start with liquid foods such as gelatin or soup. Progress to regular foods as tolerated. Avoid greasy, spicy, heavy foods. If nausea and/or vomiting occur, drink only clear liquids until the nausea and/or vomiting subsides. Call your physician if vomiting continues.  Special Instructions/Symptoms: Your throat may feel dry or sore from the anesthesia or the breathing tube placed in your throat during surgery. If this causes discomfort, gargle with warm salt water. The discomfort should disappear within 24 hours.  If you had a scopolamine patch placed behind your ear for the management of post- operative nausea and/or vomiting:  1. The medication in the patch is effective for 72 hours, after which it should be removed.  Wrap patch in a tissue and discard in the trash. Wash hands thoroughly with soap and water. 2. You may remove the patch earlier than 72 hours if you experience unpleasant  side effects which may include dry mouth, dizziness or visual disturbances. 3. Avoid touching the patch. Wash your hands with soap and water after contact with the patch.

## 2017-09-02 NOTE — Interval H&P Note (Signed)
History and Physical Interval Note:  09/02/2017 10:31 AM  Alicia Stokes  has presented today for surgery, with the diagnosis of ABDOMINAL WALL MASS   The various methods of treatment have been discussed with the patient and family. After consideration of risks, benefits and other options for treatment, the patient has consented to  Procedure(s) with comments: REMOVAL ABDOMINAL WALL MASS (N/A) - GENERAL AND LMA  as a surgical intervention .  The patient's history has been reviewed, patient examined, no change in status, stable for surgery.  I have reviewed the patient's chart and labs.  Questions were answered to the patient's satisfaction.     Jackolyn Confer

## 2017-09-02 NOTE — Anesthesia Preprocedure Evaluation (Addendum)
Anesthesia Evaluation  Patient identified by MRN, date of birth, ID band Patient awake    Reviewed: Allergy & Precautions, NPO status , Patient's Chart, lab work & pertinent test results  Airway Mallampati: II  TM Distance: >3 FB Neck ROM: Full    Dental  (+) Teeth Intact, Dental Advisory Given   Pulmonary neg pulmonary ROS,    Pulmonary exam normal breath sounds clear to auscultation       Cardiovascular negative cardio ROS Normal cardiovascular exam Rhythm:Regular Rate:Normal     Neuro/Psych PSYCHIATRIC DISORDERS Anxiety Depression negative neurological ROS     GI/Hepatic Neg liver ROS, GERD  Medicated,Abdominal wall mass    Endo/Other  Hypothyroidism   Renal/GU negative Renal ROS     Musculoskeletal negative musculoskeletal ROS (+)   Abdominal   Peds  Hematology negative hematology ROS (+)   Anesthesia Other Findings Day of surgery medications reviewed with the patient.  Reproductive/Obstetrics                            Anesthesia Physical Anesthesia Plan  ASA: II  Anesthesia Plan: General   Post-op Pain Management:    Induction: Intravenous  PONV Risk Score and Plan: 4 or greater and Scopolamine patch - Pre-op, Midazolam, Dexamethasone and Ondansetron  Airway Management Planned: Oral ETT  Additional Equipment:   Intra-op Plan:   Post-operative Plan: Extubation in OR  Informed Consent: I have reviewed the patients History and Physical, chart, labs and discussed the procedure including the risks, benefits and alternatives for the proposed anesthesia with the patient or authorized representative who has indicated his/her understanding and acceptance.   Dental advisory given  Plan Discussed with: CRNA  Anesthesia Plan Comments: (Risks/benefits of general anesthesia discussed with patient including risk of damage to teeth, lips, gum, and tongue, nausea/vomiting, allergic  reactions to medications, and the possibility of heart attack, stroke and death.  All patient questions answered.  Patient wishes to proceed.)        Anesthesia Quick Evaluation

## 2017-09-02 NOTE — H&P (Signed)
Alicia Stokes  History of Present Illness  The patient is a 36 year old female.   Note:She was referred by Dr. Drema Dallas for consultation regarding at 3.3 cm abdominal wall mass. She noticed this about 2-3 months ago. There are times when it causes spasmodic-type pain. She thinks it may be getting a little larger. CT scan was done which demonstrated a 3.3 cm x 2.5 cm abdominal wall subcutaneous mass just to the left of the midline. By CT, the suspicion was for endometriosis. No evidence of hernia. No history of anything like this before. I have reviewed the CT scan. She works and has a 2-year-old child.  Past Surgical History  Oral Surgery  Allergies  No Known Drug Allergies   Prior to Admission medications   Medication Sig Start Date End Date Taking? Authorizing Provider  calcium carbonate (TUMS - DOSED IN MG ELEMENTAL CALCIUM) 500 MG chewable tablet Chew 1 tablet as needed by mouth for indigestion or heartburn.   Yes [provider]  escitalopram (LEXAPRO) 10 MG tablet Take 10 mg daily by mouth.   Yes [provider]  levothyroxine (SYNTHROID, LEVOTHROID) 112 MCG tablet Take 112 mcg daily before breakfast by mouth.   Yes [provider]  ibuprofen (ADVIL,MOTRIN) 600 MG tablet Take 1 tablet (600 mg total) by mouth every 6 (six) hours as needed. 12/25/14   Donnel Saxon, CNM  levothyroxine (SYNTHROID, LEVOTHROID) 88 MCG tablet Take 88 mcg by mouth daily before breakfast.    [provider]  oxyCODONE-acetaminophen (PERCOCET/ROXICET) 5-325 MG per tablet Take 1 tablet by mouth every 4 (four) hours as needed (for pain scale less than 7). 12/25/14   Donnel Saxon, CNM  pantoprazole (PROTONIX) 40 MG tablet Take 40 mg by mouth daily.    [provider]  Prenatal Vit-Fe Fumarate-FA (PRENATAL MULTIVITAMIN) TABS tablet Take 1 tablet by mouth daily at 12 noon.    [provider]    Social History  Alcohol use Moderate alcohol  use. Caffeine use Carbonated beverages, Coffee. No drug use Tobacco use Never smoker.  Family History  Colon Polyps Mother. Depression Mother. Migraine Headache Mother. Thyroid problems Father.  Pregnancy / Birth History  Age at menarche 64 years. Contraceptive History Oral contraceptives. Gravida 2 Length (months) of breastfeeding 3-6 Maternal age 59-35 Para 1 Regular periods  Other Problems  Anxiety Disorder Depression Gastroesophageal Reflux Disease Hemorrhoids Thyroid Disease  Physical Exam  The physical exam findings are as follows: Note:GENERAL APPEARANCE: WDWN in NAD. Pleasant and cooperative.  EARS, NOSE, MOUTH THROAT: [place]/AT external ears: no lesions or deformities external nose: no lesions or deformities hearing: grossly normal lips: moist, no deformities EYES external: conjunctiva, lids, sclerae normal pupils: equal, round glasses: no  CV ascultation: RRR, no murmur extremity edema: no   RESP/CHEST auscultation: breath sounds equal and clear respiratory effort: normal  GASTROINTESTINAL abdomen: Soft, non-tender, non-distended, 4.5 cm fixed abdominal wall mass just to the left of the midline in epigastric region. liver and spleen: not enlarged. 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 It also appears to me to be larger today than it was on a CT scan measuring 4.5 cm.  Etiology unknown. Does not look like a lipoma on CT scan.  Plan: I recommended excision of the mass under  general anesthesia has an outpatient. The procedure and risks- including but not limited to bleeding, infection, wound healing problems, anesthesia, recurrence, need for other operations. She seems to understand and would like to proceed.  Jackolyn Confer, M.D.

## 2017-09-05 ENCOUNTER — Encounter (HOSPITAL_BASED_OUTPATIENT_CLINIC_OR_DEPARTMENT_OTHER): Payer: Self-pay | Admitting: General Surgery

## 2017-09-06 MED FILL — LEVOTHYROXINE 112 MCG TAB: 112 | 30 days supply | Qty: 30 | Fill #6

## 2017-09-15 MED FILL — ESCITALOPRAM 10 MG TABLET: 10 | 90 days supply | Qty: 90 | Fill #1

## 2017-09-30 ENCOUNTER — Other Ambulatory Visit: Payer: Self-pay | Admitting: General Surgery

## 2017-10-03 DIAGNOSIS — Z3189 Encounter for other procreative management: Secondary | ICD-10-CM | POA: Diagnosis not present

## 2017-10-06 MED FILL — LEVOTHYROXINE 112 MCG TAB: 112 | 30 days supply | Qty: 30 | Fill #7

## 2017-10-22 NOTE — Pre-Procedure Instructions (Signed)
TAYONA SARNOWSKI  10/22/2017       Your procedure is scheduled on January 9.  Report to [facility] Admitting at 11 A.M.  Call this number if you have problems the morning of surgery:  [phone]   Remember:  Do not eat food or drink liquids after midnight.  Take these medicines the morning of surgery with A SIP OF WATER Lexapro, Levothyroxine    STOP/ Do not take Aspirin, Aleve, Naproxen, Advil, Ibuprofen, Motrin, Vitamins, Herbs, or Supplements starting today   Do not wear jewelry, make-up or nail polish.  Do not wear lotions, powders, or perfumes, or deodorant.  Do not shave 48 hours prior to surgery.  Men may shave face and neck.  Do not bring valuables to the hospital.  [facility] is not responsible for any belongings or valuables.  Contacts, dentures or bridgework may not be worn into surgery.  Leave your suitcase in the car.  After surgery it may be brought to your room.  For patients admitted to the hospital, discharge time will be determined by your treatment team.  Patients discharged the day of surgery will not be allowed to drive home.   [place] - Preparing for Surgery  Before surgery, you can play an important role.  Because skin is not sterile, your skin needs to be as free of germs as possible.  You can reduce the number of germs on you skin by washing with CHG (chlorahexidine gluconate) soap before surgery.  CHG is an antiseptic cleaner which kills germs and bonds with the skin to continue killing germs even after washing.  Please DO NOT use if you have an allergy to CHG or antibacterial soaps.  If your skin becomes reddened/irritated stop using the CHG and inform your nurse when you arrive at Short Stay.  Do not shave (including legs and underarms) for at least 48 hours prior to the first CHG shower.  You may shave your face.  Please follow these instructions carefully:   1.  Shower with CHG Soap the night before surgery and the morning of  Surgery.  2.  If you choose to wash your hair, wash your hair first as usual with your normal shampoo.  3.  After you shampoo, rinse your hair and body thoroughly to remove the shampoo.  4.  Use CHG as you would any other liquid soap.  You can apply CHG directly to the skin and wash gently with scrungie or a clean washcloth.  5.  Apply the CHG Soap to your body ONLY FROM THE NECK DOWN.  Do not use on open wounds or open sores.  Avoid contact with your eyes, ears, mouth and genitals (private parts).  Wash genitals (private parts) with your normal soap.  6.  Wash thoroughly, paying special attention to the area where your surgery will be performed.  7.  Thoroughly rinse your body with warm water from the neck down.  8.  DO NOT shower/wash with your normal soap after using and rinsing off the CHG Soap.  9.  Pat yourself dry with a clean towel.            10.  Wear clean pajamas.            11.  Place clean sheets on your bed the night of your first shower and do not sleep with pets.  Day of Surgery  Do not apply any lotions the morning of surgery.  Please wear clean  clothes to the hospital/surgery center.

## 2017-10-24 ENCOUNTER — Other Ambulatory Visit: Payer: Self-pay

## 2017-10-24 ENCOUNTER — Encounter (HOSPITAL_COMMUNITY): Payer: Self-pay

## 2017-10-24 ENCOUNTER — Encounter (HOSPITAL_COMMUNITY)
Admission: RE | Admit: 2017-10-24 | Discharge: 2017-10-24 | Disposition: A | Discharge status: Home / Self Care | Payer: No Typology Code available for payment source | Source: Ambulatory Visit | Attending: General Surgery | Admitting: General Surgery

## 2017-10-24 DIAGNOSIS — F418 Other specified anxiety disorders: Secondary | ICD-10-CM | POA: Diagnosis not present

## 2017-10-24 DIAGNOSIS — K219 Gastro-esophageal reflux disease without esophagitis: Secondary | ICD-10-CM | POA: Diagnosis not present

## 2017-10-24 DIAGNOSIS — Z79899 Other long term (current) drug therapy: Secondary | ICD-10-CM | POA: Diagnosis not present

## 2017-10-24 DIAGNOSIS — Z79890 Hormone replacement therapy (postmenopausal): Secondary | ICD-10-CM | POA: Diagnosis not present

## 2017-10-24 DIAGNOSIS — K439 Ventral hernia without obstruction or gangrene: Secondary | ICD-10-CM | POA: Diagnosis not present

## 2017-10-24 DIAGNOSIS — E039 Hypothyroidism, unspecified: Secondary | ICD-10-CM | POA: Diagnosis not present

## 2017-10-24 DIAGNOSIS — D481 Neoplasm of uncertain behavior of connective and other soft tissue: Secondary | ICD-10-CM | POA: Diagnosis not present

## 2017-10-24 LAB — CBC
HEMATOCRIT: 42.2 % (ref 36.0–46.0)
Hemoglobin: 13.6 g/dL (ref 12.0–15.0)
MCH: 29.2 pg (ref 26.0–34.0)
MCHC: 32.2 g/dL (ref 30.0–36.0)
MCV: 90.6 fL (ref 78.0–100.0)
Platelets: 249 10*3/uL (ref 150–400)
RBC: 4.66 MIL/uL (ref 3.87–5.11)
RDW: 12.3 % (ref 11.5–15.5)
WBC: 9.2 10*3/uL (ref 4.0–10.5)

## 2017-10-24 LAB — HCG, SERUM, QUALITATIVE: Preg, Serum: NEGATIVE

## 2017-10-24 NOTE — Pre-Procedure Instructions (Signed)
Alicia Stokes  10/24/2017       Your procedure is scheduled on January 9.  Report to [facility] Admitting at 1200 P.M.  Call this number if you have problems the morning of surgery:  [phone]   Remember:  Do not eat food or drink liquids after midnight.  Take these medicines the morning of surgery with A SIP OF WATER Lexapro, Levothyroxine    STOP/ Do not take Aspirin, Aleve, Naproxen, Advil, Ibuprofen, Motrin, Vitamins, Herbs, or Supplements starting today   Do not wear jewelry, make-up or nail polish.  Do not wear lotions, powders, or perfumes, or deodorant.  Do not shave 48 hours prior to surgery.  Men may shave face and neck.  Do not bring valuables to the hospital.  [facility] is not responsible for any belongings or valuables.  Contacts, dentures or bridgework may not be worn into surgery.  Leave your suitcase in the car.  After surgery it may be brought to your room.  For patients admitted to the hospital, discharge time will be determined by your treatment team.  Patients discharged the day of surgery will not be allowed to drive home.    - Preparing for Surgery  Before surgery, you can play an important role.  Because skin is not sterile, your skin needs to be as free of germs as possible.  You can reduce the number of germs on you skin by washing with CHG (chlorahexidine gluconate) soap before surgery.  CHG is an antiseptic cleaner which kills germs and bonds with the skin to continue killing germs even after washing.  Please DO NOT use if you have an allergy to CHG or antibacterial soaps.  If your skin becomes reddened/irritated stop using the CHG and inform your nurse when you arrive at Short Stay.  Do not shave (including legs and underarms) for at least 48 hours prior to the first CHG shower.  You may shave your face.  Please follow these instructions carefully:   1.  Shower with CHG Soap the night before surgery and the morning  of Surgery.  2.  If you choose to wash your hair, wash your hair first as usual with your normal shampoo.  3.  After you shampoo, rinse your hair and body thoroughly to remove the shampoo.  4.  Use CHG as you would any other liquid soap.  You can apply CHG directly to the skin and wash gently with scrungie or a clean washcloth.  5.  Apply the CHG Soap to your body ONLY FROM THE NECK DOWN.  Do not use on open wounds or open sores.  Avoid contact with your eyes, ears, mouth and genitals (private parts).  Wash genitals (private parts) with your normal soap.  6.  Wash thoroughly, paying special attention to the area where your surgery will be performed.  7.  Thoroughly rinse your body with warm water from the neck down.  8.  DO NOT shower/wash with your normal soap after using and rinsing off the CHG Soap.  9.  Pat yourself dry with a clean towel.            10.  Wear clean pajamas.            11.  Place clean sheets on your bed the night of your first shower and do not sleep with pets.  Day of Surgery  Do not apply any lotions the morning of surgery.  Please wear clean  clothes to the hospital/surgery center.

## 2017-10-25 NOTE — H&P (Signed)
  Alicia Stokes Documented: 09/30/2017 10:42 AM Location: [facility] Patient #: [id number] DOB: 1981-01-31 Married / Language: Alicia Stokes / Race: White Female   History of Present Illness Alicia Klein MD; 09/30/2017 3:47 PM) The patient is a 37 year old female who presents with an abdominal mass. Patient underwent resection of abdominal wall mass by Dr. Zella Stokes 09/02/2017. 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 She presents for discussion. She has occasional soreness and muscle cramping in the area of the incision, but has otherwise done well.  pathology Diagnosis Soft tissue mass, simple excision, abdominal wall subcutaneous - DESMOID TUMOR, SEE COMMENT. The tumor extends to the inked surgical margins of resection.   Allergies Alicia Stokes, [place]; 09/30/2017 10:43 AM) No Known Drug Allergies [07/28/2017]: Allergies Reconciled   Medication History Alicia Stokes, [place]; 09/30/2017 10:44 AM) Escitalopram Oxalate (10MG  Tablet, Oral) Active. Levothyroxine Sodium (112MCG Tablet, Oral) Active. Calcium Carbonate Antacid (500MG  Tablet Chewable, Oral) Active. Medications Reconciled    Review of Systems Alicia Klein MD; 09/30/2017 3:47 PM) All other systems negative  Vitals Alicia Stokes RMA; 09/30/2017 10:43 AM) 09/30/2017 10:42 AM Weight: 177 lb Height: 66in Body Surface Area: 1.9 m Body Mass Index: 28.57 kg/m  Temp.: 98.61F  Pulse: 97 (Regular)  BP: 125/72 (Sitting, Left Arm, Standard)       Physical Exam Alicia Klein MD; 09/30/2017 3:47 PM) Integumentary Note: Incision on the mid abdomen healing well. Healing ridge of scar tissue. no palpable mass. no evidence of infection.     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 I would take an  ellipse of tissue incorporating prior incision going town to below the prior margin.  This is not likely to metastasize, but there is a reasonable chance of recurrence if no reexcision is done. I would prefer surgical clearance rather than radiation, as I think the side effects of radiation are higher than repeat surgery.  We will plan this in January. Current Plans You are being scheduled for surgery- Our schedulers will call you.  You should hear from our office's scheduling department within 5 working days about the location, date, and time of surgery. We try to make accommodations for patient's preferences in scheduling surgery, but sometimes the OR schedule or the surgeon's schedule prevents [place] from making those accommodations.  If you have not heard from our office [phone]) in 5 working days, call the office and ask for your surgeon's nurse.  If you have other questions about your diagnosis, plan, or surgery, call the office and ask for your surgeon's nurse.    Signed by Alicia Klein, MD (09/30/2017 3:49 PM)

## 2017-10-26 ENCOUNTER — Encounter (HOSPITAL_COMMUNITY): Payer: Self-pay | Admitting: Urology

## 2017-10-26 ENCOUNTER — Ambulatory Visit (HOSPITAL_COMMUNITY): Payer: No Typology Code available for payment source | Admitting: Anesthesiology

## 2017-10-26 ENCOUNTER — Ambulatory Visit (HOSPITAL_COMMUNITY)
Admission: RE | Admit: 2017-10-26 | Discharge: 2017-10-26 | Disposition: A | Discharge status: Home / Self Care | Payer: No Typology Code available for payment source | Source: Ambulatory Visit | Attending: General Surgery | Admitting: General Surgery

## 2017-10-26 ENCOUNTER — Encounter (HOSPITAL_COMMUNITY)
Admission: RE | Disposition: A | Discharge status: Home / Self Care | Payer: Self-pay | Source: Ambulatory Visit | Attending: General Surgery

## 2017-10-26 DIAGNOSIS — K439 Ventral hernia without obstruction or gangrene: Secondary | ICD-10-CM | POA: Insufficient documentation

## 2017-10-26 DIAGNOSIS — Z79899 Other long term (current) drug therapy: Secondary | ICD-10-CM | POA: Insufficient documentation

## 2017-10-26 DIAGNOSIS — E039 Hypothyroidism, unspecified: Secondary | ICD-10-CM | POA: Insufficient documentation

## 2017-10-26 DIAGNOSIS — K219 Gastro-esophageal reflux disease without esophagitis: Secondary | ICD-10-CM | POA: Insufficient documentation

## 2017-10-26 DIAGNOSIS — F418 Other specified anxiety disorders: Secondary | ICD-10-CM | POA: Insufficient documentation

## 2017-10-26 DIAGNOSIS — Z79890 Hormone replacement therapy (postmenopausal): Secondary | ICD-10-CM | POA: Insufficient documentation

## 2017-10-26 DIAGNOSIS — D481 Neoplasm of uncertain behavior of connective and other soft tissue: Secondary | ICD-10-CM | POA: Insufficient documentation

## 2017-10-26 HISTORY — PX: INSERTION OF MESH: SHX5868

## 2017-10-26 HISTORY — PX: EXCISION OF ABDOMINAL WALL TUMOR: SHX6687

## 2017-10-26 SURGERY — EXCISION, NEOPLASM, ABDOMINAL WALL
Anesthesia: General | Site: Abdomen

## 2017-10-26 MED ORDER — ROCURONIUM BROMIDE 100 MG/10ML IV SOLN
INTRAVENOUS | Status: DC | PRN
  Administered 2017-10-26: 50 mg via INTRAVENOUS

## 2017-10-26 MED ORDER — BUPIVACAINE-EPINEPHRINE (PF) 0.25% -1:200000 IJ SOLN
INTRAMUSCULAR | Status: DC | PRN
  Administered 2017-10-26: 7 mL

## 2017-10-26 MED ORDER — OXYCODONE HCL 5 MG PO TABS
5.0000 mg | ORAL_TABLET | Freq: Once | ORAL | Status: AC | PRN
  Administered 2017-10-26: 5 mg via ORAL

## 2017-10-26 MED ORDER — DEXAMETHASONE SODIUM PHOSPHATE 10 MG/ML IJ SOLN
INTRAMUSCULAR | Status: AC
  Filled 2017-10-26: qty 1

## 2017-10-26 MED ORDER — HYDROCODONE-ACETAMINOPHEN 5-325 MG PO TABS: 1.0000 | ORAL_TABLET | Freq: Four times a day (QID) | ORAL | 0 refills | Status: DC | PRN

## 2017-10-26 MED ORDER — BUPIVACAINE LIPOSOME 1.3 % IJ SUSP
INTRAMUSCULAR | Status: DC | PRN
  Administered 2017-10-26: 20 mL

## 2017-10-26 MED ORDER — ONDANSETRON HCL 4 MG/2ML IJ SOLN
4.0000 mg | Freq: Four times a day (QID) | INTRAMUSCULAR | Status: AC | PRN
  Administered 2017-10-26: 4 mg via INTRAVENOUS

## 2017-10-26 MED ORDER — FENTANYL CITRATE (PF) 250 MCG/5ML IJ SOLN
INTRAMUSCULAR | Status: AC
  Filled 2017-10-26: qty 5

## 2017-10-26 MED ORDER — LIDOCAINE HCL (CARDIAC) 20 MG/ML IV SOLN
INTRAVENOUS | Status: DC | PRN
  Administered 2017-10-26: 60 mg via INTRAVENOUS

## 2017-10-26 MED ORDER — MIDAZOLAM HCL 2 MG/2ML IJ SOLN
INTRAMUSCULAR | Status: AC
  Filled 2017-10-26: qty 2

## 2017-10-26 MED ORDER — CEFAZOLIN SODIUM-DEXTROSE 2-4 GM/100ML-% IV SOLN
2.0000 g | INTRAVENOUS | Status: AC
  Administered 2017-10-26: 2 g via INTRAVENOUS

## 2017-10-26 MED ORDER — OXYCODONE HCL 5 MG PO TABS: 5.0000 mg | ORAL_TABLET | ORAL | Status: DC | PRN

## 2017-10-26 MED ORDER — SODIUM CHLORIDE 0.9 % IV SOLN: 250.0000 mL | INTRAVENOUS | Status: DC | PRN

## 2017-10-26 MED ORDER — ACETAMINOPHEN 650 MG RE SUPP
650.0000 mg | RECTAL | Status: DC | PRN
Start: 2017-10-26 — End: 2017-10-26

## 2017-10-26 MED ORDER — ACETAMINOPHEN 325 MG PO TABS: 650.0000 mg | ORAL_TABLET | ORAL | Status: DC | PRN

## 2017-10-26 MED ORDER — LIDOCAINE HCL (PF) 1 % IJ SOLN
INTRAMUSCULAR | Status: AC
  Filled 2017-10-26: qty 30

## 2017-10-26 MED ORDER — DEXAMETHASONE SODIUM PHOSPHATE 10 MG/ML IJ SOLN
INTRAMUSCULAR | Status: DC | PRN
  Administered 2017-10-26: 10 mg via INTRAVENOUS

## 2017-10-26 MED ORDER — MIDAZOLAM HCL 5 MG/5ML IJ SOLN
INTRAMUSCULAR | Status: DC | PRN
  Administered 2017-10-26: 2 mg via INTRAVENOUS

## 2017-10-26 MED ORDER — OXYCODONE HCL 5 MG/5ML PO SOLN: 5.0000 mg | Freq: Once | ORAL | Status: AC | PRN

## 2017-10-26 MED ORDER — CEFAZOLIN SODIUM-DEXTROSE 2-4 GM/100ML-% IV SOLN
INTRAVENOUS | Status: AC
  Filled 2017-10-26: qty 100

## 2017-10-26 MED ORDER — ONDANSETRON HCL 4 MG/2ML IJ SOLN
INTRAMUSCULAR | Status: AC
  Filled 2017-10-26: qty 2

## 2017-10-26 MED ORDER — BUPIVACAINE-EPINEPHRINE (PF) 0.25% -1:200000 IJ SOLN
INTRAMUSCULAR | Status: AC
  Filled 2017-10-26: qty 30

## 2017-10-26 MED ORDER — HYDROMORPHONE HCL 1 MG/ML IJ SOLN
0.2500 mg | INTRAMUSCULAR | Status: DC | PRN
  Administered 2017-10-26 (×3): 0.5 mg via INTRAVENOUS

## 2017-10-26 MED ORDER — CHLORHEXIDINE GLUCONATE CLOTH 2 % EX PADS: 6.0000 | MEDICATED_PAD | Freq: Once | CUTANEOUS | Status: DC

## 2017-10-26 MED ORDER — HYDROMORPHONE HCL 1 MG/ML IJ SOLN
INTRAMUSCULAR | Status: AC
  Administered 2017-10-26: 0.5 mg via INTRAVENOUS
  Filled 2017-10-26: qty 1

## 2017-10-26 MED ORDER — SODIUM CHLORIDE 0.9% FLUSH: 3.0000 mL | INTRAVENOUS | Status: DC | PRN

## 2017-10-26 MED ORDER — SODIUM CHLORIDE 0.9% FLUSH: 3.0000 mL | Freq: Two times a day (BID) | INTRAVENOUS | Status: DC

## 2017-10-26 MED ORDER — ONDANSETRON HCL 4 MG/2ML IJ SOLN
INTRAMUSCULAR | Status: DC | PRN
  Administered 2017-10-26: 4 mg via INTRAVENOUS

## 2017-10-26 MED ORDER — SUGAMMADEX SODIUM 200 MG/2ML IV SOLN
INTRAVENOUS | Status: AC
  Filled 2017-10-26: qty 2

## 2017-10-26 MED ORDER — LIDOCAINE 2% (20 MG/ML) 5 ML SYRINGE
INTRAMUSCULAR | Status: AC
  Filled 2017-10-26: qty 5

## 2017-10-26 MED ORDER — OXYCODONE HCL 5 MG PO TABS
ORAL_TABLET | ORAL | Status: AC
  Administered 2017-10-26: 5 mg via ORAL
  Filled 2017-10-26: qty 1

## 2017-10-26 MED ORDER — PROPOFOL 10 MG/ML IV BOLUS
INTRAVENOUS | Status: DC | PRN
  Administered 2017-10-26: 140 mg via INTRAVENOUS
  Administered 2017-10-26: 30 mg via INTRAVENOUS

## 2017-10-26 MED ORDER — SUGAMMADEX SODIUM 200 MG/2ML IV SOLN
INTRAVENOUS | Status: DC | PRN
  Administered 2017-10-26: 150 mg via INTRAVENOUS

## 2017-10-26 MED ORDER — PROPOFOL 10 MG/ML IV BOLUS
INTRAVENOUS | Status: AC
  Filled 2017-10-26: qty 20

## 2017-10-26 MED ORDER — LACTATED RINGERS IV SOLN
INTRAVENOUS | Status: DC
  Administered 2017-10-26 (×2): via INTRAVENOUS

## 2017-10-26 MED ORDER — BUPIVACAINE LIPOSOME 1.3 % IJ SUSP
20.0000 mL | INTRAMUSCULAR | Status: DC
  Filled 2017-10-26: qty 20

## 2017-10-26 MED ORDER — FENTANYL CITRATE (PF) 100 MCG/2ML IJ SOLN
INTRAMUSCULAR | Status: DC | PRN
  Administered 2017-10-26: 100 ug via INTRAVENOUS
  Administered 2017-10-26: 50 ug via INTRAVENOUS
  Administered 2017-10-26: 100 ug via INTRAVENOUS

## 2017-10-26 SURGICAL SUPPLY — 41 items
BENZOIN TINCTURE PRP APPL 2/3 (GAUZE/BANDAGES/DRESSINGS) ×4 IMPLANT
BLADE CLIPPER SURG (BLADE) IMPLANT
CANISTER SUCT 3000ML PPV (MISCELLANEOUS) ×4 IMPLANT
CHLORAPREP W/TINT 26ML (MISCELLANEOUS) ×4 IMPLANT
CLOSURE WOUND 1/2 X4 (GAUZE/BANDAGES/DRESSINGS) ×1
COVER SURGICAL LIGHT HANDLE (MISCELLANEOUS) ×4 IMPLANT
DRAPE LAPAROSCOPIC ABDOMINAL (DRAPES) ×4 IMPLANT
DRAPE UTILITY XL STRL (DRAPES) ×8 IMPLANT
DRSG COVADERM 4X6 (GAUZE/BANDAGES/DRESSINGS) ×4 IMPLANT
ELECT CAUTERY BLADE 6.4 (BLADE) ×4 IMPLANT
ELECT REM PT RETURN 9FT ADLT (ELECTROSURGICAL) ×4
ELECTRODE REM PT RTRN 9FT ADLT (ELECTROSURGICAL) ×2 IMPLANT
GLOVE BIO SURGEON STRL SZ 6 (GLOVE) ×8 IMPLANT
GLOVE BIOGEL PI IND STRL 6 (GLOVE) ×2 IMPLANT
GLOVE BIOGEL PI INDICATOR 6 (GLOVE) ×2
GLOVE INDICATOR 6.5 STRL GRN (GLOVE) ×4 IMPLANT
GOWN STRL REUS W/ TWL LRG LVL3 (GOWN DISPOSABLE) ×2 IMPLANT
GOWN STRL REUS W/TWL 2XL LVL3 (GOWN DISPOSABLE) ×4 IMPLANT
GOWN STRL REUS W/TWL LRG LVL3 (GOWN DISPOSABLE) ×2
KIT BASIN OR (CUSTOM PROCEDURE TRAY) ×4 IMPLANT
KIT ROOM TURNOVER OR (KITS) ×4 IMPLANT
MESH ULTRAPRO 3X6 7.6X15CM (Mesh General) ×4 IMPLANT
NEEDLE HYPO 25GX1X1/2 BEV (NEEDLE) ×4 IMPLANT
NS IRRIG 1000ML POUR BTL (IV SOLUTION) ×4 IMPLANT
PACK GENERAL/GYN (CUSTOM PROCEDURE TRAY) ×4 IMPLANT
PAD ARMBOARD 7.5X6 YLW CONV (MISCELLANEOUS) ×8 IMPLANT
STRIP CLOSURE SKIN 1/2X4 (GAUZE/BANDAGES/DRESSINGS) ×3 IMPLANT
SUT MNCRL AB 4-0 PS2 18 (SUTURE) ×8 IMPLANT
SUT NOVA NAB DX-16 0-1 5-0 T12 (SUTURE) IMPLANT
SUT PROLENE 2 0 CT2 30 (SUTURE) IMPLANT
SUT PROLENE 2 0 SH 30 (SUTURE) ×8 IMPLANT
SUT SILK 3 0 SH 30 (SUTURE) ×4 IMPLANT
SUT VIC AB 2-0 SH 27 (SUTURE) ×6
SUT VIC AB 2-0 SH 27XBRD (SUTURE) ×6 IMPLANT
SUT VIC AB 3-0 SH 27 (SUTURE) ×8
SUT VIC AB 3-0 SH 27X BRD (SUTURE) ×8 IMPLANT
SUT VICRYL 0 UR6 27IN ABS (SUTURE) ×4 IMPLANT
SYR CONTROL 10ML LL (SYRINGE) ×8 IMPLANT
TOWEL OR 17X24 6PK STRL BLUE (TOWEL DISPOSABLE) ×4 IMPLANT
TOWEL OR 17X26 10 PK STRL BLUE (TOWEL DISPOSABLE) ×4 IMPLANT
TRAY FOLEY W/METER SILVER 14FR (SET/KITS/TRAYS/PACK) IMPLANT

## 2017-10-26 NOTE — Op Note (Signed)
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  FINDINGS:  No residual tumor, had to remove a portion of left rectus fascia.  Medially there was a total fascial defect with preperitoneal fat visible, laterally there was anterior fascia absent post excision  EBL: min   PROCEDURE:  Patient was identified in the holding area and taken to the operating room where she was placed supine on the operating table.  General endotracheal anesthesia was induced.  The patient's abdomen was prepped and draped in sterile fashion.  A timeout was performed according to the surgical safety checklist.  When all was correct, we continued.  The patient's previous incision was identified and local anesthetic was administered around the incision.  An elliptical incision with approximately a centimeter margin on all sides or the prior incision was created.  The subcutaneous tissue was divided with the cautery.  We went straight down avoiding inflammatory tissue from her previous surgery.  When coming underneath the previous scar, the anterior fascia of the rectus was taken.  Medially, the defect encompassed the fused fascia and the preperitoneal fat was visible.  The reexcision specimen was oriented in situ prior to passing it off the table.  The fascial defect was closed with running 0 Vicryl.  A piece of  ultra Pro mesh was selected and an elliptical piece was cut out of it cover the defect and overlap.  2-0 Prolene was used to secure this to the anterior fascia.  The cavity was irrigated.  Exparel was placed into the muscle layer.  The skin was closed with 2 layers of running 2-0 Vicryl, 2 layers of interrupted 3-0 Vicryl, and running 4-0 Monocryl subcuticular suture.  There was no tension on the wound.  The skin was then cleaned, dried, and dressed with benzoin, Steri-Strips, and a Tegaderm dressing.  The patient was allowed to emerge from anesthesia and was taken to the PACU in stable condition.  Needle, sponge, and instrument counts were correct x2.

## 2017-10-26 NOTE — Discharge Instructions (Addendum)
[facility] °Office Phone Number [phone] ° ° POST OP INSTRUCTIONS ° °Always review your discharge instruction sheet given to you by the facility where your surgery was performed. ° °IF YOU HAVE DISABILITY OR FAMILY LEAVE FORMS, YOU MUST BRING THEM TO THE OFFICE FOR PROCESSING.  DO NOT GIVE THEM TO YOUR DOCTOR. ° °1. A prescription for pain medication may be given to you upon discharge.  Take your pain medication as prescribed, if needed.  If narcotic pain medicine is not needed, then you may take acetaminophen (Tylenol) or ibuprofen (Advil) as needed. °2. Take your usually prescribed medications unless otherwise directed °3. If you need a refill on your pain medication, please contact your pharmacy.  They will contact our office to request authorization.  Prescriptions will not be filled after 5pm or on week-ends. °4. You should eat very light the first 24 hours after surgery, such as soup, crackers, pudding, etc.  Resume your normal diet the day after surgery °5. It is common to experience some constipation if taking pain medication after surgery.  Increasing fluid intake and taking a stool softener will usually help or prevent this problem from occurring.  A mild laxative (Milk of Magnesia or Miralax) should be taken according to package directions if there are no bowel movements after 48 hours. °6. You may shower in 48 hours.  The surgical glue will flake off in 2-3 weeks.   °7. ACTIVITIES:  No strenuous activity or heavy lifting for 1 week.   °a. You may drive when you no longer are taking prescription pain medication, you can comfortably wear a seatbelt, and you can safely maneuver your car and apply brakes. °b. RETURN TO WORK:  __________to be determined._______________ °You should see your doctor in the office for a follow-up appointment approximately three-four weeks after your surgery.   ° °WHEN TO CALL YOUR DOCTOR: °1. Fever over 101.0 °2. Nausea and/or vomiting. °3. Extreme swelling  or bruising. °4. Continued bleeding from incision. °5. Increased pain, redness, or drainage from the incision. ° °The clinic staff is available to answer your questions during regular business hours.  Please don’t hesitate to call and ask to speak to one of the nurses for clinical concerns.  If you have a medical emergency, go to the nearest emergency room or call 911.  A surgeon from [facility] is always on call at the hospital. ° °For further questions, please visit [email or web address]  ° °

## 2017-10-26 NOTE — Anesthesia Preprocedure Evaluation (Addendum)
Anesthesia Evaluation  Patient identified by MRN, date of birth, ID band Patient awake    Reviewed: Allergy & Precautions, H&P , NPO status , Patient's Chart, lab work & pertinent test results  Airway Mallampati: II   Neck ROM: full    Dental  (+) Teeth Intact, Dental Advisory Given   Pulmonary neg pulmonary ROS,    breath sounds clear to auscultation       Cardiovascular negative cardio ROS   Rhythm:regular Rate:Normal     Neuro/Psych PSYCHIATRIC DISORDERS Anxiety Depression    GI/Hepatic GERD  Medicated and Controlled,  Endo/Other  Hypothyroidism   Renal/GU      Musculoskeletal   Abdominal   Peds  Hematology   Anesthesia Other Findings   Reproductive/Obstetrics                            Anesthesia Physical Anesthesia Plan  ASA: II  Anesthesia Plan: General   Post-op Pain Management:    Induction: Intravenous  PONV Risk Score and Plan: 3 and Ondansetron, Dexamethasone, Midazolam and Treatment may vary due to age or medical condition  Airway Management Planned: Oral ETT  Additional Equipment:   Intra-op Plan:   Post-operative Plan: Extubation in OR  Informed Consent: I have reviewed the patients History and Physical, chart, labs and discussed the procedure including the risks, benefits and alternatives for the proposed anesthesia with the patient or authorized representative who has indicated his/her understanding and acceptance.     Plan Discussed with: CRNA, Anesthesiologist and Surgeon  Anesthesia Plan Comments:         Anesthesia Quick Evaluation

## 2017-10-26 NOTE — Anesthesia Procedure Notes (Addendum)

## 2017-10-26 NOTE — Transfer of Care (Signed)
Immediate Anesthesia Transfer of Care Note  Patient: Alicia Stokes  Procedure(s) Performed: RE-EXCISION ABDOMINAL WALL DESMOID TUMOR WITH ADVANCEMENT FLAP CLOSURE (N/A ) INSERTION OF MESH (N/A Abdomen)  Patient Location: PACU  Anesthesia Type:General  Level of Consciousness: awake, oriented and patient cooperative  Airway & Oxygen Therapy: Patient Spontanous Breathing and Patient connected to nasal cannula oxygen  Post-op Assessment: Report given to RN and Post -op Vital signs reviewed and stable  Post vital signs: Reviewed  Last Vitals:  Vitals:   10/26/17 1205  BP: 113/65  Pulse: (!) 57  Resp: 20  Temp: 36.8 C  SpO2: 100%    Last Pain:  Vitals:   10/26/17 1205  TempSrc: Oral         Complications: No apparent anesthesia complications

## 2017-10-26 NOTE — Interval H&P Note (Signed)
History and Physical Interval Note:  10/26/2017 2:43 PM  Alicia Stokes  has presented today for surgery, with the diagnosis of ADBOMINAL WALL DESMOID TUMOR  The various methods of treatment have been discussed with the patient and family. After consideration of risks, benefits and other options for treatment, the patient has consented to  Procedure(s) with comments: RE-EXCISION ABDOMINAL WALL DESMOID TUMOR WITH ADVANCEMENT FLAP CLOSURE (N/A) - GENERAL AND LOCAL as a surgical intervention .  The patient's history has been reviewed, patient examined, no change in status, stable for surgery.  I have reviewed the patient's chart and labs.  Questions were answered to the patient's satisfaction.     Stark Klein

## 2017-10-27 ENCOUNTER — Encounter (HOSPITAL_COMMUNITY): Payer: Self-pay | Admitting: General Surgery

## 2017-10-27 MED FILL — HYDROCODON-APAP 5-325: 5-325 | 3 days supply | Qty: 20 | Fill #0

## 2017-10-27 NOTE — Anesthesia Postprocedure Evaluation (Signed)
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 (N/A ) INSERTION OF MESH (N/A Abdomen)     Patient location during evaluation: PACU Anesthesia Type: General Level of consciousness: awake and alert Pain management: pain level controlled Vital Signs Assessment: post-procedure vital signs reviewed and stable Respiratory status: spontaneous breathing, nonlabored ventilation, respiratory function stable and patient connected to nasal cannula oxygen Cardiovascular status: blood pressure returned to baseline and stable Postop Assessment: no apparent nausea or vomiting Anesthetic complications: no    Last Vitals:  Vitals:   10/26/17 1756 10/26/17 1819  BP: 119/74 120/71  Pulse: 70 74  Resp: 20 18  Temp: 37 C 37 C  SpO2: 94% 94%    Last Pain:  Vitals:   10/26/17 1736  TempSrc:   PainSc: 3                  Ryan P Ellender

## 2017-11-01 NOTE — Progress Notes (Signed)
Please let patient know that reexcision margins are all negative and showed no evidence of tumor.

## 2017-11-08 MED FILL — LEVOTHYROXINE 112 MCG TAB: 112 | 30 days supply | Qty: 30 | Fill #8

## 2017-12-05 MED FILL — LEVOTHYROXINE 112 MCG TAB: 112 | 30 days supply | Qty: 30 | Fill #9

## 2017-12-15 MED FILL — ESCITALOPRAM 10 MG TABLET: 10 | 90 days supply | Qty: 90 | Fill #0

## 2018-01-09 MED FILL — LEVOTHYROXINE 112 MCG TAB: 112 | 30 days supply | Qty: 30 | Fill #10

## 2018-02-09 MED FILL — LEVOTHYROXINE 112 MCG TAB: 112 | 30 days supply | Qty: 30 | Fill #11

## 2018-03-10 MED FILL — LEVOTHYROXINE 112 MCG TAB: 112 | 90 days supply | Qty: 90 | Fill #0

## 2018-03-13 ENCOUNTER — Encounter: Payer: Self-pay | Admitting: Adult Health

## 2018-03-13 ENCOUNTER — Ambulatory Visit (INDEPENDENT_AMBULATORY_CARE_PROVIDER_SITE_OTHER): Payer: Self-pay | Admitting: Adult Health

## 2018-03-13 VITALS — BP 122/86 | HR 102 | Temp 98.7°F | Resp 16 | Wt 181.4 lb

## 2018-03-13 DIAGNOSIS — E669 Obesity, unspecified: Secondary | ICD-10-CM | POA: Insufficient documentation

## 2018-03-13 DIAGNOSIS — E039 Hypothyroidism, unspecified: Secondary | ICD-10-CM | POA: Insufficient documentation

## 2018-03-13 DIAGNOSIS — H1031 Unspecified acute conjunctivitis, right eye: Secondary | ICD-10-CM

## 2018-03-13 DIAGNOSIS — R87619 Unspecified abnormal cytological findings in specimens from cervix uteri: Secondary | ICD-10-CM | POA: Insufficient documentation

## 2018-03-13 DIAGNOSIS — K219 Gastro-esophageal reflux disease without esophagitis: Secondary | ICD-10-CM | POA: Insufficient documentation

## 2018-03-13 MED ORDER — CIPROFLOXACIN HCL 0.3 % OP SOLN: 2.0000 [drp] | OPHTHALMIC | 0 refills | Status: AC

## 2018-03-13 NOTE — Progress Notes (Addendum)
Subjective:     Patient ID: Alicia Stokes, female   DOB: 03/04/1981, 37 y.o.   MRN: [id number]  Blood pressure 122/86, pulse (!) 102, temperature 98.7 F (37.1 C), temperature source Oral, resp. rate 16, weight 181 lb 6.4 oz (82.3 kg), SpO2 98 %, unknown if currently breastfeeding. Conjunctivitis   The current episode started 2 days ago (she has had cold symptoms x 1 week. ). The onset was gradual. The problem occurs continuously. Associated symptoms include congestion, headaches (mild with allergies ), rhinorrhea, sore throat, eye discharge (yellow ) and eye redness. Pertinent negatives include no orthopnea, no fever, no decreased vision, no double vision, no eye itching, no photophobia (mild ), no abdominal pain, no constipation, no diarrhea, no nausea, no vomiting, no ear discharge, no ear pain, no hearing loss, no mouth sores, no stridor, no swollen glands, no muscle aches, no neck pain, no neck stiffness, no cough, no URI, no wheezing, no rash, no diaper rash and no eye pain. The eye pain is mild. The right eye is affected. The eye pain is not associated with movement.    Patient is a 37 year old female in no acute distress who comes to the clinic with complaints of right eye discharge.  Son has pink eye and also cold symptoms.   Denies any chance of pregnancy.  Not lactating.  Review of Systems  Constitutional: Negative for fever.  HENT: Positive for congestion, rhinorrhea and sore throat. Negative for ear discharge, ear pain, hearing loss and mouth sores.   Eyes: Positive for discharge (yellow ) and redness. Negative for double vision, photophobia (mild ), pain and itching.  Respiratory: Negative for cough, wheezing and stridor.   Cardiovascular: Negative for orthopnea.  Gastrointestinal: Negative for abdominal pain, constipation, diarrhea, nausea and vomiting.  Musculoskeletal: Negative for neck pain.  Skin: Negative for rash.  Neurological: Positive for headaches (mild with  allergies ).       Objective:   Physical Exam  Constitutional: She is oriented to person, place, and time. Vital signs are normal. She appears well-developed and well-nourished. She is active.  Non-toxic appearance. She does not have a sickly appearance. She does not appear ill. No distress.  Patient is alert and oriented and responsive to questions Engages in eye contact with provider. Speaks in full sentences without any pauses without any shortness of breath or distress.    Patient is alert and oriented and responsive to questions Engages in eye contact with provider. Speaks in full sentences without any pauses without any shortness of breath or distress.    HENT:  Head: Normocephalic.  Right Ear: Hearing and external ear normal. No foreign bodies. Tympanic membrane is not perforated and not erythematous. A middle ear effusion is present.  Left Ear: Hearing, external ear and ear canal normal. No foreign bodies. Tympanic membrane is not perforated and not erythematous. A middle ear effusion is present.  Nose: Nose normal. No mucosal edema or rhinorrhea. Right sinus exhibits no maxillary sinus tenderness and no frontal sinus tenderness. Left sinus exhibits no maxillary sinus tenderness and no frontal sinus tenderness.  Mouth/Throat: Uvula is midline, oropharynx is clear and moist and mucous membranes are normal. No oropharyngeal exudate, posterior oropharyngeal edema, posterior oropharyngeal erythema or tonsillar abscesses.  Eyes: Pupils are equal, round, and reactive to light. EOM are normal. Lids are everted and swept, no foreign bodies found. Right eye exhibits exudate (exudate mild and yellow on upper and lower eyelid). Right eye exhibits no chemosis, no  discharge and no hordeolum. No foreign body present in the right eye. Left eye exhibits no chemosis, no discharge, no exudate and no hordeolum. No foreign body present in the left eye. Right conjunctiva is injected (mildly ). Right conjunctiva  has no hemorrhage. Left conjunctiva is not injected. Left conjunctiva has no hemorrhage. No scleral icterus. Right eye exhibits normal extraocular motion and no nystagmus. Left eye exhibits normal extraocular motion and no nystagmus.  No periorbital tenderness or edema.  Vision was normal with glasses 20/20 both eyes per CMA,  She denies any pain,she denies any vision change.     Neck: Normal range of motion. Neck supple.  Cardiovascular: Normal rate, regular rhythm, normal heart sounds and intact distal pulses. Exam reveals no gallop and no friction rub.  No murmur heard. Pulmonary/Chest: Effort normal and breath sounds normal. No stridor. No respiratory distress. She has no wheezes. She has no rales. She exhibits no tenderness.  Abdominal: Soft. Bowel sounds are normal. She exhibits no distension and no mass. There is no tenderness. There is no rebound and no guarding. No hernia.  Musculoskeletal: Normal range of motion.  Neurological: She is alert and oriented to person, place, and time.  Skin: Skin is warm and dry. Capillary refill takes less than 2 seconds. No rash noted. She is not diaphoretic. No erythema. No pallor.  Psychiatric: She has a normal mood and affect. Her behavior is normal. Judgment and thought content normal.  Vitals reviewed.      Assessment:     Acute conjunctivitis of right eye, unspecified acute conjunctivitis type      Plan:     Meds ordered this encounter  Medications  . ciprofloxacin (CILOXAN) 0.3 % ophthalmic solution    Sig: Place 2 drops into the right eye every 4 (four) hours while awake for 10 days.    Dispense:  5 mL    Refill:  0   Advised patient call the office or your primary care doctor for an appointment if no improvement within 72 hours or if any symptoms change or worsen at any time  Advised ER or urgent Care if after hours or on weekend. Call 911 for emergency symptoms at any time.Patinet verbalized understanding of all instructions  given/reviewed and treatment plan and has no further questions or concerns at this time.    Patient verbalized understanding of all instructions given and denies any further questions at this time.

## 2018-03-13 NOTE — Patient Instructions (Signed)

## 2018-03-15 ENCOUNTER — Telehealth: Payer: Self-pay

## 2018-03-16 ENCOUNTER — Telehealth: Payer: No Typology Code available for payment source | Admitting: Family

## 2018-03-16 DIAGNOSIS — J028 Acute pharyngitis due to other specified organisms: Secondary | ICD-10-CM

## 2018-03-16 DIAGNOSIS — B9689 Other specified bacterial agents as the cause of diseases classified elsewhere: Secondary | ICD-10-CM | POA: Diagnosis not present

## 2018-03-16 MED ORDER — PREDNISONE 5 MG PO TABS: 5.0000 mg | ORAL_TABLET | ORAL | 0 refills | Status: DC

## 2018-03-16 MED ORDER — BENZONATATE 100 MG PO CAPS: 100.0000 mg | ORAL_CAPSULE | Freq: Three times a day (TID) | ORAL | 0 refills | Status: DC | PRN

## 2018-03-16 MED ORDER — AZITHROMYCIN 250 MG PO TABS: ORAL_TABLET | ORAL | 0 refills | Status: DC

## 2018-03-16 MED FILL — AZITHROMYCIN 250 MG TABLET: 250 | 5 days supply | Qty: 6 | Fill #0

## 2018-03-16 MED FILL — BENZONATATE 100 MG CAPS: 100 | 5 days supply | Qty: 30 | Fill #0

## 2018-03-16 MED FILL — predniSONE 5 MG TABS: 5 | 6 days supply | Qty: 21 | Fill #0

## 2018-03-16 NOTE — Progress Notes (Signed)

## 2018-03-21 MED FILL — ESCITALOPRAM 10 MG TABLET: 10 | 90 days supply | Qty: 90 | Fill #0

## 2018-03-28 ENCOUNTER — Telehealth: Payer: No Typology Code available for payment source | Admitting: Family

## 2018-03-28 DIAGNOSIS — J329 Chronic sinusitis, unspecified: Secondary | ICD-10-CM

## 2018-03-28 NOTE — Progress Notes (Signed)
Based on what you shared with me it looks like you have a condition that should be evaluated in a face to face office visit.  NOTE: If you entered your credit card information for this eVisit, you will not be charged. You may see a "hold" on your card for the $30 but that hold will drop off and you will not have a charge processed.  If you are having a true medical emergency please call 911.  If you need an urgent face to face visit, [place] has four urgent care centers for your convenience.  If you need care fast and have a high deductible or no insurance consider:   [email or web address] to reserve your spot online an avoid wait times  [facility] [address], [address] [place], [place] [address] 8 am to 8 pm Monday-Friday 10 am to 4 pm Saturday-Sunday *Across the street from [employer]  [facility]  [address] [place], [address] 8 am to 5 pm Monday-Friday * In the [facility]   The following sites will take your  insurance:  . [facility]  [phone] Get Driving Directions Find a Provider at this Location  [address] [place], [place] [address] . 10 am to 8 pm Monday-Friday . 12 pm to 8 pm Saturday-Sunday   . [facility]  [phone] Get Driving Directions Find a Provider at this Location  [address], [address] [place], [place] [address] . 8 am to 8 pm Monday-Friday . 9 am to 6 pm Saturday . 11 am to 6 pm Sunday   .  [facility] at [facility] [place]  [phone] Get Driving Directions  [address] [place], [place] [address] . 8 am to 8 pm Monday-Friday . 8 am to 4 pm Saturday-Sunday   Your e-visit answers were reviewed by a board certified advanced clinical practitioner to complete your personal care plan.  Thank you for using e-Visits. 

## 2018-05-25 ENCOUNTER — Ambulatory Visit (INDEPENDENT_AMBULATORY_CARE_PROVIDER_SITE_OTHER): Payer: Self-pay | Admitting: Family Medicine

## 2018-05-25 VITALS — BP 118/78 | HR 65 | Temp 98.5°F | Resp 20 | Wt 183.8 lb

## 2018-05-25 DIAGNOSIS — R350 Frequency of micturition: Secondary | ICD-10-CM

## 2018-05-25 DIAGNOSIS — N760 Acute vaginitis: Secondary | ICD-10-CM

## 2018-05-25 LAB — POCT URINALYSIS DIPSTICK
Bilirubin, UA: NEGATIVE
Blood, UA: NEGATIVE
Glucose, UA: NEGATIVE
Ketones, UA: NEGATIVE
LEUKOCYTES UA: NEGATIVE
Nitrite, UA: NEGATIVE
PROTEIN UA: NEGATIVE
Spec Grav, UA: 1.015 (ref 1.010–1.025)
Urobilinogen, UA: 0.2 E.U./dL
pH, UA: 7.5 (ref 5.0–8.0)

## 2018-05-25 MED ORDER — FLUCONAZOLE 150 MG PO TABS: 150.0000 mg | ORAL_TABLET | Freq: Once | ORAL | 0 refills | Status: AC

## 2018-05-25 MED FILL — FLUCONAZOLE 150 MG TABS: 150 | 2 days supply | Qty: 2 | Fill #0

## 2018-05-25 NOTE — Patient Instructions (Signed)

## 2018-05-25 NOTE — Progress Notes (Signed)
Alicia Stokes is a 37 y.o. female who presents today with concerns of vaginal itching x 1 week. She also reports increased urination x 1 month. She denies any risk for STI/STD and reports that she has not attempted to treat this condition up to this point.  Review of Systems  Constitutional: Negative for chills, fever and malaise/fatigue.  HENT: Negative for congestion, ear discharge, ear pain, sinus pain and sore throat.   Eyes: Negative.   Respiratory: Negative for cough, sputum production and shortness of breath.   Cardiovascular: Negative.  Negative for chest pain.  Gastrointestinal: Negative for abdominal pain, diarrhea, nausea and vomiting.  Genitourinary: Negative for dysuria, frequency, hematuria and urgency.       Vaginal irritation/itching  Musculoskeletal: Negative for myalgias.  Skin: Negative.   Neurological: Negative for headaches.  Endo/Heme/Allergies: Negative.   Psychiatric/Behavioral: Negative.     O: Vitals:   05/25/18 1633  BP: 118/78  Pulse: 65  Resp: 20  Temp: 98.5 F (36.9 C)  SpO2: 98%     Physical Exam  Constitutional: She is oriented to person, place, and time. Vital signs are normal. She appears well-developed and well-nourished. She is active.  Non-toxic appearance. She does not have a sickly appearance.  HENT:  Head: Normocephalic.  Right Ear: Hearing and external ear normal.  Left Ear: Hearing and external ear normal.  Nose: Nose normal.  Mouth/Throat: Oropharynx is clear and moist.  Neck: Normal range of motion. Neck supple.  Cardiovascular: Normal rate, regular rhythm, normal heart sounds and normal pulses.  Pulmonary/Chest: Effort normal and breath sounds normal.  Abdominal: Soft. Bowel sounds are normal.  Musculoskeletal: Normal range of motion.  Lymphadenopathy:       Head (right side): No submental and no submandibular adenopathy present.       Head (left side): No submental and no submandibular adenopathy present.    She has no  cervical adenopathy.  Neurological: She is alert and oriented to person, place, and time.  Psychiatric: She has a normal mood and affect.  Vitals reviewed.  A: 1. Frequent urination   2. Acute vaginitis     P: Discussed exam findings, diagnosis etiology and medication use and indications reviewed with patient. Follow- Up and discharge instructions provided. No emergent/urgent issues found on exam.  Patient verbalized understanding of information provided and agrees with plan of care (POC), all questions answered.  1. Frequent urination - POCT urinalysis dipstick Results for orders placed or performed in visit on 05/25/18 (from the past 24 hour(s))  POCT urinalysis dipstick     Status: Normal   Collection Time: 05/25/18  4:42 PM  Result Value Ref Range   Color, UA     Clarity, UA     Glucose, UA Negative Negative   Bilirubin, UA Neg    Ketones, UA Neg    Spec Grav, UA 1.015 1.010 - 1.025   Blood, UA Neg    pH, UA 7.5 5.0 - 8.0   Protein, UA Negative Negative   Urobilinogen, UA 0.2 0.2 or 1.0 E.U./dL   Nitrite, UA Neg    Leukocytes, UA Negative Negative   Appearance     Odor      2. Acute vaginitis Suspect vaginitis caused by yeast- patient currently denies discharge or odor- will trial and have patient f/u if no improvement in 96 hours. Concern due to pt reported stress incontinence may be contributor for symptoms. Discussed info on further evaluation a urogynocologist.  - fluconazole (DIFLUCAN) 150 MG  tablet; Take 1 tablet (150 mg total) by mouth once for 1 dose.

## 2018-06-02 MED FILL — TERCONAZOLE 0.4% VAG CREAM: 0.4 | 7 days supply | Qty: 45 | Fill #0

## 2018-06-02 MED FILL — FLUCONAZOLE 150 MG TABS: 150 | 1 days supply | Qty: 1 | Fill #0

## 2018-06-13 MED FILL — LEVOTHYROXINE 112 MCG TAB: 112 | 90 days supply | Qty: 90 | Fill #1

## 2018-06-27 MED FILL — ESCITALOPRAM 10 MG TABLET: 10 | 90 days supply | Qty: 90 | Fill #0

## 2018-09-29 MED FILL — LEVOTHYROXINE 112 MCG TAB: 112 | 90 days supply | Qty: 90 | Fill #0

## 2018-10-05 MED FILL — OXYBUTYNIN CL ER 10 MG TAB: 10 | 30 days supply | Qty: 30 | Fill #0

## 2018-10-12 MED FILL — ESCITALOPRAM 10 MG TABLET: 10 | 90 days supply | Qty: 90 | Fill #1

## 2018-11-28 MED FILL — TOLTERODINE TART ER 4 MG CA: 4 | 30 days supply | Qty: 30 | Fill #0

## 2019-01-01 MED FILL — LEVOTHYROXINE 112 MCG TAB: 112 | 90 days supply | Qty: 90 | Fill #1

## 2019-01-11 MED FILL — ESCITALOPRAM 10 MG TABLET: 10 | 90 days supply | Qty: 90 | Fill #0

## 2019-01-19 ENCOUNTER — Encounter: Payer: Self-pay | Admitting: Family

## 2019-01-19 ENCOUNTER — Telehealth: Payer: No Typology Code available for payment source | Admitting: Family

## 2019-01-19 DIAGNOSIS — H1010 Acute atopic conjunctivitis, unspecified eye: Secondary | ICD-10-CM

## 2019-01-19 NOTE — Progress Notes (Signed)
We are sorry that you are not feeling well.  Here is how we plan to help!  Based on what you have shared with me it looks like you have conjunctivitis.  Conjunctivitis is a common inflammatory or infectious condition of the eye that is often referred to as "pink eye".  In most cases it is contagious (viral or bacterial). However, not all conjunctivitis requires antibiotics (ex. Allergic).  We have made appropriate suggestions for you based upon your presentation.  I recommend that you use OpconA, 1-2 drops every 4-6 hours (an over the counter allergy drop available at your local pharmacy).  Your pharmacist may have an alternative suggestion.  Pink eye can be highly contagious.  It is typically spread through direct contact with secretions, or contaminated objects or surfaces that one may have touched.  Strict handwashing is suggested with soap and water is urged.  If not available, use alcohol based had sanitizer.  Avoid unnecessary touching of the eye.  If you wear contact lenses, you will need to refrain from wearing them until you see no white discharge from the eye for at least 24 hours after being on medication.  You should see symptom improvement in 1-2 days after starting the medication regimen.  Call us if symptoms are not improved in 1-2 days.  Home Care:  Wash your hands often!  Do not wear your contacts until you complete your treatment plan.  Avoid sharing towels, bed linen, personal items with a person who has pink eye.  See attention for anyone in your home with similar symptoms.  Get Help Right Away If:  Your symptoms do not improve.  You develop blurred or loss of vision.  Your symptoms worsen (increased discharge, pain or redness)  Your e-visit answers were reviewed by a board certified advanced clinical practitioner to complete your personal care plan.  Depending on the condition, your plan could have included both over the counter or prescription medications.  If there  is a problem please reply  once you have received a response from your provider.  Your safety is important to us.  If you have drug allergies check your prescription carefully.    You can use MyChart to ask questions about today's visit, request a non-urgent call back, or ask for a work or school excuse for 24 hours related to this e-Visit. If it has been greater than 24 hours you will need to follow up with your provider, or enter a new e-Visit to address those concerns.   You will get an e-mail in the next two days asking about your experience.  I hope that your e-visit has been valuable and will speed your recovery. Thank you for using e-visits.     

## 2019-02-08 ENCOUNTER — Telehealth: Payer: No Typology Code available for payment source | Admitting: Physician Assistant

## 2019-02-08 DIAGNOSIS — R34 Anuria and oliguria: Secondary | ICD-10-CM

## 2019-02-08 DIAGNOSIS — R109 Unspecified abdominal pain: Secondary | ICD-10-CM

## 2019-02-08 DIAGNOSIS — R197 Diarrhea, unspecified: Secondary | ICD-10-CM

## 2019-02-08 NOTE — Progress Notes (Signed)
Based on what you shared with me, I feel your condition warrants further evaluation and I recommend that you be seen for a face to face office visit.  Hopefully this is a mild gastroenteritis from more common causes and not from [place]. However it can present with just GI symptoms. Giving the decreased urine output, darker urine and continued abdominal pain, you will need to be seen in person, either by PCP or at an Urgent care as kidney function needs to be assessed and more concerning causes of abdominal pain need to be ruled out.   NOTE: If you entered your credit card information for this eVisit, you will not be charged. You may see a "hold" on your card for the $35 but that hold will drop off and you will not have a charge processed.  If you are having a true medical emergency please call 911.  If you need an urgent face to face visit, [place] has four urgent care centers for your convenience.    PLEASE NOTE: THE [facility] AND URGENT CARE CLINICS DO NOT HAVE THE TESTING FOR CORONAVIRUS COVID19 AVAILABLE.  IF YOU FEEL YOU NEED THIS TEST YOU MUST GO TO A TRIAGE LOCATION AT [place] [facility] [place]   [email or web address] to reserve your spot online an avoid wait times  [facility] [address], [address] [place], [place] [address] Modified hours of operation: Monday-Friday, 10 AM to 6 PM  Saturday & Sunday 10 AM to 4 PM *Across the street from [place] (New Address!) [address] [place], [place] [address] *Just off [employer], across the road from [place] hours of operation: Monday-Friday, 10 AM to 5 PM  Closed Saturday & Sunday   The following sites will take your insurance:  . [facility] [place] a Provider at this Location  [address] [place], [place] [address] . 10 am to 8 pm Monday-Friday . 12 pm to  8 pm Saturday-Sunday   . [facility] at [place] a Provider at this Location  [address], [place], [place] [address] . 8 am to 8 pm Monday-Friday . 9 am to 6 pm Saturday . 11 am to 6 pm Sunday   . [facility] at [place] Get Driving Directions  [address], [place] [address] . 8 am to 8 pm Monday-Friday . 8 am to 4 pm Saturday-Sunday   Your e-visit answers were reviewed by a board certified advanced clinical practitioner to complete your personal care plan.  Thank you for using e-Visits.

## 2019-02-09 ENCOUNTER — Ambulatory Visit
Admission: EM | Admit: 2019-02-09 | Discharge: 2019-02-09 | Disposition: A | Discharge status: Home / Self Care | Payer: No Typology Code available for payment source | Attending: Physician Assistant | Admitting: Physician Assistant

## 2019-02-09 DIAGNOSIS — R197 Diarrhea, unspecified: Secondary | ICD-10-CM | POA: Diagnosis not present

## 2019-02-09 MED ORDER — AZITHROMYCIN 250 MG PO TABS: 250.0000 mg | ORAL_TABLET | Freq: Every day | ORAL | 0 refills | Status: DC

## 2019-02-09 MED ORDER — DICYCLOMINE HCL 20 MG PO TABS: 20.0000 mg | ORAL_TABLET | Freq: Two times a day (BID) | ORAL | 0 refills | Status: AC

## 2019-02-09 MED ORDER — AZITHROMYCIN 500 MG PO TABS: 500.0000 mg | ORAL_TABLET | Freq: Every day | ORAL | 0 refills | Status: AC

## 2019-02-09 MED FILL — AZITHROMYCIN 500 MG TABLET: 500 | 3 days supply | Qty: 3 | Fill #0

## 2019-02-09 MED FILL — DICYCLOMINE 20 MG TABLET: 20 | 10 days supply | Qty: 20 | Fill #0

## 2019-02-09 NOTE — ED Triage Notes (Signed)
Pt c/o lower abdominal pain with diarrhea since Tuesday. Stool is liquid and clear to yellow in color

## 2019-02-09 NOTE — Discharge Instructions (Addendum)
Bentyl as directed. Keep hydrated, you urine should be clear to pale yellow in color. Bland diet, advance as tolerated. Monitor for any worsening of symptoms, nausea or vomiting not controlled by medication, worsening abdominal pain, fever, follow-up for reevaluation.  If symptoms not improving, can fill azithromycin as directed for infectious diarrhea.

## 2019-02-09 NOTE — ED Provider Notes (Addendum)
[facility]    CSN: [id number] Arrival date & time: 02/09/19  1057     History   Chief Complaint Chief Complaint  Patient presents with  . Diarrhea    HPI Alicia Stokes is a 38 y.o. female.   38 year old female comes in with 4 day history of diarrhea and abdominal pain. States she has had 5-8 episodes of explosive watery stools per day. Denies melena, hematochezia. Low abdominal pain that is cramping in sensation, waxes and wanes in intensity, no improvement after BM. Denies nausea, vomiting. Denies urinary frequency, dysuria, hematuria. Has had darker urine. LMP 01/25/2019. No breastfeeding. No recent antibiotic use.     Past Medical History:  Diagnosis Date  . Abdominal wall mass   . Abnormal pap 2010 ascus   . Abnormal Pap smear   . Anxiety 2011  . Bacterial infection   . Bladder infection   . CIN I (cervical intraepithelial neoplasia I) 2010  . Depression 2011  . GERD (gastroesophageal reflux disease)    hx of   . H/O urinary frequency 2005  . History of chicken pox   . History of PCOS   . Hypothyroidism   . Polycystic disease, ovaries   . Stress incontinence   . Yeast infection     Patient Active Problem List   Diagnosis Date Noted  . Obesity 03/13/2018  . Hypothyroidism 03/13/2018  . Gastroesophageal reflux disease 03/13/2018  . Abnormal cervical Papanicolaou smear 03/13/2018  . Vaginal delivery 12/23/2014  . Preterm premature rupture of membranes (PPROM) with onset of labor within 24 hours of rupture in third trimester, antepartum 12/22/2014  . Abnormal MSAFP (maternal serum alpha-fetoprotein), elevated 09/18/2014  . Female factor infertility 08/22/2014  . Dysplasia of cervix, low grade (CIN 1) 02/16/2012  . 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/2012    Past Surgical History:  Procedure Laterality Date  . COLPOSCOPY  2010  . DILATION AND EVACUATION N/A 12/28/2013   Procedure: DILATATION AND EVACUATION;  Surgeon: [name], MD;   Location: [facility];  Service: Gynecology;  Laterality: N/A;  . EXCISION OF ABDOMINAL WALL TUMOR N/A 10/26/2017   Procedure: RE-EXCISION ABDOMINAL WALL DESMOID TUMOR WITH ADVANCEMENT FLAP CLOSURE;  Surgeon: Stark Klein, MD;  Location: [facility];  Service: General;  Laterality: N/A;  GENERAL AND LOCAL  . INSERTION OF MESH N/A 10/26/2017   Procedure: INSERTION OF MESH;  Surgeon: Stark Klein, MD;  Location: [facility];  Service: General;  Laterality: N/A;  . invitro  11/06/13  . MASS EXCISION N/A 09/02/2017   Procedure: REMOVAL ABDOMINAL WALL MASS;  Surgeon: Jackolyn Confer, MD;  Location: [facility];  Service: General;  Laterality: N/A;  GENERAL AND LMA   . NO PAST SURGERIES     ivf    5 years  . WISDOM TOOTH EXTRACTION    . WISDOM TOOTH EXTRACTION  2000    OB History    Gravida  2   Para  1   Term  0   Preterm  1   AB  1   Living  1     SAB  1   TAB  0   Ectopic  0   Multiple  0   Live Births  1            Home Medications    Prior to Admission medications   Medication Sig Start Date End Date Taking? Authorizing Provider  azithromycin (ZITHROMAX) 500 MG tablet Take 1 tablet (500  mg total) by mouth daily for 3 days. 02/09/19 02/12/19  Tasia Catchings, Dagmawi Venable V, PA-C  calcium carbonate (TUMS - DOSED IN MG ELEMENTAL CALCIUM) 500 MG chewable tablet Chew 2 tablets by mouth daily as needed for indigestion or heartburn.     [provider]  dicyclomine (BENTYL) 20 MG tablet Take 1 tablet (20 mg total) by mouth 2 (two) times daily. 02/09/19   Tasia Catchings, Jaspreet Hollings V, PA-C  escitalopram (LEXAPRO) 10 MG tablet Take 10 mg daily by mouth.    [provider]  levothyroxine (SYNTHROID, LEVOTHROID) 112 MCG tablet Take 112 mcg daily before breakfast by mouth.    [provider]  Multiple Vitamins-Minerals (MULTIVITAMIN PO) Take 1 tablet by mouth daily.    [provider]  naproxen sodium (ALEVE) 220 MG tablet Take 440 mg by mouth daily as needed (for pain or headache).     [provider]    Family History Family History  Problem Relation Age of Onset  . Depression Mother   . Hyperthyroidism Father   . Lung disease Maternal Grandmother 20       has one lung due to tb  . Diabetes Maternal Grandfather   . Hypertension Maternal Grandfather   . COPD Paternal Grandmother     Social History Social History   Tobacco Use  . Smoking status: Never Smoker  . Smokeless tobacco: Never Used  Substance Use Topics  . Alcohol use: Yes    Alcohol/week: 12.0 standard drinks    Types: 10 Cans of beer, 2 Standard drinks or equivalent per week    Comment: occ  . Drug use: No     Allergies   Patient has no known allergies.   Review of Systems Review of Systems  Reason unable to perform ROS: See HPI as above.     Physical Exam Triage Vital Signs ED Triage Vitals [02/09/19 1107]  Enc Vitals Group     BP 137/80     Pulse Rate 65     Resp 18     Temp 98.4 F (36.9 C)     Temp src      SpO2 98 %     Weight      Height      Head Circumference      Peak Flow      Pain Score 3     Pain Loc      Pain Edu?      Excl. in [place]?    No data found.  Updated Vital Signs BP 137/80 (BP Location: Left Arm)   Pulse 65   Temp 98.4 F (36.9 C)   Resp 18   LMP 01/25/2019   SpO2 98%   Physical Exam Constitutional:      General: She is not in acute distress.    Appearance: She is well-developed. She is not ill-appearing, toxic-appearing or diaphoretic.  HENT:     Head: Normocephalic and atraumatic.  Eyes:     Conjunctiva/sclera: Conjunctivae normal.     Pupils: Pupils are equal, round, and reactive to light.  Cardiovascular:     Rate and Rhythm: Normal rate and regular rhythm.     Heart sounds: Normal heart sounds. No murmur. No friction rub. No gallop.   Pulmonary:     Effort: Pulmonary effort is normal.     Breath sounds: Normal breath sounds. No wheezing or rales.  Abdominal:     General: Bowel sounds are normal.     Palpations:  Abdomen is soft.  Tenderness: There is no abdominal tenderness. There is no right CVA tenderness, left CVA tenderness, guarding or rebound.  Skin:    General: Skin is warm and dry.  Neurological:     Mental Status: She is alert and oriented to person, place, and time.  Psychiatric:        Behavior: Behavior normal.        Judgment: Judgment normal.      UC Treatments / Results  Labs (all labs ordered are listed, but only abnormal results are displayed) Labs Reviewed - No data to display  EKG None  Radiology No results found.  Procedures Procedures (including critical care time)  Medications Ordered in UC Medications - No data to display  Initial Impression / Assessment and Plan / UC Course  I have reviewed the triage vital signs and the nursing notes.  Pertinent labs & imaging results that were available during my care of the patient were reviewed by me and considered in my medical decision making (see chart for details).    Discussed with patient no alarming signs on exam. Bentyl for abdominal cramping.  Push fluids. Bland diet, advance as tolerated. Patient works as a Automotive engineer, and is having problems working due to frequency of stools. Written Rx of Azithromycin provided, patient can take in 4 days if symptoms still not improving for presumed infectious diarrhea. Return precautions given.  Final Clinical Impressions(s) / UC Diagnoses   Final diagnoses:  Diarrhea, unspecified type   ED Prescriptions    Medication Sig Dispense Auth. Provider   dicyclomine (BENTYL) 20 MG tablet Take 1 tablet (20 mg total) by mouth 2 (two) times daily. 20 tablet Hurman Ketelsen V, PA-C   azithromycin (ZITHROMAX) 250 MG tablet  (Status: Discontinued) Take 1 tablet (250 mg total) by mouth daily. Take first 2 tablets together, then 1 every day until finished. 6 tablet Giannina Bartolome V, PA-C   azithromycin (ZITHROMAX) 500 MG tablet Take 1 tablet (500 mg total) by mouth daily for 3 days. 3 tablet Tobin Chad, PA-C 02/09/19 Adrian, Camreigh Michie V, PA-C 02/09/19 1201

## 2019-04-16 MED FILL — ESCITALOPRAM 10 MG TABLET: 10 | 90 days supply | Qty: 90 | Fill #0

## 2019-04-16 MED FILL — ESOMEPRAZOLE MAG DR 20 MG C: 20 | 90 days supply | Qty: 90 | Fill #0

## 2019-04-16 MED FILL — LEVOTHYROXINE 112 MCG TAB: 112 | 90 days supply | Qty: 90 | Fill #0

## 2019-05-16 MED FILL — TERCONAZOLE 0.4% VAG CREAM: 0.4 | 7 days supply | Qty: 45 | Fill #0

## 2019-05-16 MED FILL — FEMYNOR 0.25-35 MG-MCG TABS: 0.25-35 | 28 days supply | Qty: 28 | Fill #0

## 2019-06-11 MED FILL — FEMYNOR 0.25-35 MG-MCG TABS: 0.25-35 | 28 days supply | Qty: 28 | Fill #1

## 2019-07-18 MED FILL — ESOMEPRAZOLE MAG DR 20 MG C: 20 | 90 days supply | Qty: 90 | Fill #1

## 2019-07-18 MED FILL — ESCITALOPRAM 10 MG TABLET: 10 | 90 days supply | Qty: 90 | Fill #1

## 2019-07-18 MED FILL — LEVOTHYROXINE 112 MCG TAB: 112 | 90 days supply | Qty: 90 | Fill #1

## 2019-09-06 MED FILL — FEMYNOR 0.25-35 MG-MCG TABS: 0.25-35 | 28 days supply | Qty: 28 | Fill #2

## 2019-09-27 MED FILL — FEMYNOR 0.25-35 MG-MCG TABS: 0.25-35 | 84 days supply | Qty: 84 | Fill #2

## 2019-10-22 MED FILL — ESCITALOPRAM 10 MG TABLET: 10 | 90 days supply | Qty: 90 | Fill #2

## 2019-10-22 MED FILL — LEVOTHYROXINE SODIUM 112 MC: 112 | 90 days supply | Qty: 90 | Fill #2

## 2019-10-22 MED FILL — ESOMEPRAZOLE MAG DR 20 MG C: 20 | 90 days supply | Qty: 90 | Fill #2

## 2019-11-28 NOTE — Telephone Encounter (Signed)
Error

## 2019-12-24 MED FILL — FEMYNOR 0.25-35 MG-MCG TABS: 0.25-35 | 84 days supply | Qty: 84 | Fill #3

## 2020-01-24 MED FILL — LEVOTHYROXINE SODIUM 112 MC: 112 | 90 days supply | Qty: 90 | Fill #3

## 2020-01-24 MED FILL — ESOMEPRAZOLE MAG DR 20 MG C: 20 | 90 days supply | Qty: 90 | Fill #3

## 2020-01-24 MED FILL — ESCITALOPRAM 10 MG TABLET: 10 | 90 days supply | Qty: 90 | Fill #3

## 2020-03-04 MED FILL — ESCITALOPRAM 20 MG TABLET: 20 | 90 days supply | Qty: 90 | Fill #0

## 2020-03-11 MED FILL — FEMYNOR 0.25-35 MG-MCG TABS: 0.25-35 | 84 days supply | Qty: 84 | Fill #4

## 2020-04-24 ENCOUNTER — Other Ambulatory Visit (HOSPITAL_COMMUNITY): Payer: Self-pay | Admitting: Family Medicine

## 2020-04-24 MED FILL — LEVOTHYROXINE SODIUM 112 MC: 112 | 90 days supply | Qty: 90 | Fill #0

## 2020-04-25 ENCOUNTER — Other Ambulatory Visit (HOSPITAL_COMMUNITY): Payer: Self-pay | Admitting: Family Medicine

## 2020-04-25 MED FILL — ESOMEPRAZOLE MAG DR 20 MG C: 20 | 90 days supply | Qty: 90 | Fill #0

## 2020-06-02 MED FILL — ESCITALOPRAM 20 MG TABLET: 20 | 90 days supply | Qty: 90 | Fill #1

## 2020-06-03 MED FILL — FEMYNOR 0.25-35 MG-MCG TABS: 0.25-35 | 84 days supply | Qty: 84 | Fill #0

## 2020-07-23 MED FILL — ESOMEPRAZOLE MAG DR 20 MG C: 20 | 90 days supply | Qty: 90 | Fill #1

## 2020-07-23 MED FILL — LEVOTHYROXINE SODIUM 112 MC: 112 | 90 days supply | Qty: 90 | Fill #1

## 2020-08-21 ENCOUNTER — Other Ambulatory Visit (HOSPITAL_COMMUNITY): Payer: Self-pay | Admitting: Obstetrics and Gynecology

## 2020-08-21 MED FILL — FEMYNOR 0.25-35 MG-MCG TABS: 0.25-35 | 84 days supply | Qty: 84 | Fill #0

## 2020-09-08 ENCOUNTER — Ambulatory Visit: Payer: No Typology Code available for payment source | Attending: Internal Medicine

## 2020-09-08 DIAGNOSIS — Z23 Encounter for immunization: Secondary | ICD-10-CM

## 2020-09-08 NOTE — Progress Notes (Signed)
   Covid-19 Vaccination Clinic  Name:  Alicia Stokes    MRN: [id number] DOB: 1981-04-06  09/08/2020  [name] was observed post Covid-19 immunization for 15 minutes without incident. She was provided with Vaccine Information Sheet and instruction to access the V-Safe system.   [name] was instructed to call 911 with any severe reactions post vaccine: Marland Kitchen Difficulty breathing  . Swelling of face and throat  . A fast heartbeat  . A bad rash all over body  . Dizziness and weakness   Immunizations Administered    Name Date Dose VIS Date Route   Pfizer COVID-19 Vaccine 09/08/2020  1:20 PM 0.3 mL 08/06/2020 Intramuscular   Manufacturer: [place]   Lot: [id number]   [place]: [id number]

## 2020-10-09 ENCOUNTER — Other Ambulatory Visit (HOSPITAL_COMMUNITY): Payer: Self-pay | Admitting: Family Medicine

## 2020-10-09 MED FILL — ESCITALOPRAM 20 MG TABLET: 20 | 90 days supply | Qty: 90 | Fill #0

## 2020-10-23 ENCOUNTER — Other Ambulatory Visit (HOSPITAL_COMMUNITY): Payer: Self-pay | Admitting: Family Medicine

## 2020-10-23 MED FILL — ESOMEPRAZOLE MAG DR 20 MG C: 20 | 90 days supply | Qty: 90 | Fill #0

## 2020-10-23 MED FILL — LEVOTHYROXINE SODIUM 112 MC: 112 | 90 days supply | Qty: 90 | Fill #2

## 2020-11-13 ENCOUNTER — Other Ambulatory Visit (HOSPITAL_COMMUNITY): Payer: Self-pay | Admitting: Obstetrics and Gynecology

## 2020-11-13 MED FILL — FEMYNOR 0.25-35 MG-MCG TABS: 0.25-35 | 84 days supply | Qty: 84 | Fill #0

## 2021-01-14 ENCOUNTER — Other Ambulatory Visit (HOSPITAL_COMMUNITY): Payer: Self-pay | Admitting: Family Medicine

## 2021-01-14 MED FILL — DICYCLOMINE 20 MG TABLET: 20 | 30 days supply | Qty: 90 | Fill #0

## 2021-01-19 ENCOUNTER — Other Ambulatory Visit: Payer: Self-pay | Admitting: Family Medicine

## 2021-01-19 DIAGNOSIS — D481 Neoplasm of uncertain behavior of connective and other soft tissue: Secondary | ICD-10-CM

## 2021-01-19 DIAGNOSIS — R1084 Generalized abdominal pain: Secondary | ICD-10-CM

## 2021-01-19 DIAGNOSIS — R197 Diarrhea, unspecified: Secondary | ICD-10-CM

## 2021-01-27 ENCOUNTER — Other Ambulatory Visit (HOSPITAL_COMMUNITY): Payer: Self-pay

## 2021-01-27 MED FILL — Esomeprazole Magnesium Cap Delayed Release 20 MG (Base Eq): ORAL | 90 days supply | Qty: 90 | Fill #0 | Status: AC

## 2021-01-27 MED FILL — Levothyroxine Sodium Tab 112 MCG: ORAL | 90 days supply | Qty: 90 | Fill #0 | Status: AC

## 2021-02-09 ENCOUNTER — Other Ambulatory Visit (HOSPITAL_COMMUNITY): Payer: Self-pay

## 2021-02-09 MED FILL — Norgestimate & Ethinyl Estradiol Tab 0.25 MG-35 MCG: ORAL | 84 days supply | Qty: 84 | Fill #0 | Status: AC

## 2021-02-10 ENCOUNTER — Other Ambulatory Visit: Payer: No Typology Code available for payment source

## 2021-02-18 ENCOUNTER — Inpatient Hospital Stay: Admission: RE | Admit: 2021-02-18 | Payer: No Typology Code available for payment source | Source: Ambulatory Visit

## 2021-03-25 ENCOUNTER — Other Ambulatory Visit (HOSPITAL_COMMUNITY): Payer: Self-pay

## 2021-03-25 MED ORDER — ESCITALOPRAM OXALATE 10 MG PO TABS
10.0000 mg | ORAL_TABLET | Freq: Every day | ORAL | 3 refills | Status: DC
  Filled 2021-03-25: qty 90, 90d supply, fill #0
  Filled 2021-06-24 – 2021-07-10 (×2): qty 90, 90d supply, fill #1

## 2021-03-25 MED ORDER — HYDROXYZINE HCL 25 MG PO TABS
25.0000 mg | ORAL_TABLET | Freq: Three times a day (TID) | ORAL | 0 refills | Status: DC | PRN
  Filled 2021-03-25: qty 60, 20d supply, fill #0

## 2021-04-03 ENCOUNTER — Other Ambulatory Visit (HOSPITAL_COMMUNITY): Payer: Self-pay

## 2021-04-03 MED ORDER — LEVOTHYROXINE SODIUM 112 MCG PO TABS
112.0000 ug | ORAL_TABLET | Freq: Every day | ORAL | 4 refills | Status: DC
  Filled 2021-04-03 – 2021-04-16 (×2): qty 90, 90d supply, fill #0
  Filled 2021-08-03: qty 90, 90d supply, fill #1
  Filled 2021-11-03: qty 90, 90d supply, fill #2
  Filled 2022-02-01: qty 90, 90d supply, fill #3

## 2021-04-03 MED ORDER — ESOMEPRAZOLE MAGNESIUM 20 MG PO CPDR
20.0000 mg | DELAYED_RELEASE_CAPSULE | Freq: Every day | ORAL | 4 refills | Status: DC
  Filled 2021-04-03 – 2021-04-16 (×2): qty 90, 90d supply, fill #0
  Filled 2021-08-03: qty 90, 90d supply, fill #1

## 2021-04-03 MED ORDER — CLONAZEPAM 0.5 MG PO TABS
0.5000 mg | ORAL_TABLET | Freq: Two times a day (BID) | ORAL | 0 refills | Status: AC | PRN
  Filled 2021-04-03: qty 30, 15d supply, fill #0

## 2021-04-13 ENCOUNTER — Other Ambulatory Visit (HOSPITAL_COMMUNITY): Payer: Self-pay

## 2021-04-13 MED ORDER — PENICILLIN V POTASSIUM 500 MG PO TABS
500.0000 mg | ORAL_TABLET | Freq: Three times a day (TID) | ORAL | 0 refills | Status: DC
  Filled 2021-04-13: qty 21, 7d supply, fill #0

## 2021-04-16 ENCOUNTER — Other Ambulatory Visit (HOSPITAL_COMMUNITY): Payer: Self-pay

## 2021-05-08 ENCOUNTER — Other Ambulatory Visit (HOSPITAL_COMMUNITY): Payer: Self-pay

## 2021-05-08 MED FILL — Norgestimate & Ethinyl Estradiol Tab 0.25 MG-35 MCG: ORAL | 84 days supply | Qty: 84 | Fill #1 | Status: AC

## 2021-06-24 ENCOUNTER — Other Ambulatory Visit (HOSPITAL_COMMUNITY): Payer: Self-pay

## 2021-07-02 ENCOUNTER — Other Ambulatory Visit (HOSPITAL_COMMUNITY): Payer: Self-pay

## 2021-07-10 ENCOUNTER — Other Ambulatory Visit (HOSPITAL_COMMUNITY): Payer: Self-pay

## 2021-08-03 ENCOUNTER — Other Ambulatory Visit (HOSPITAL_COMMUNITY): Payer: Self-pay

## 2021-08-03 MED ORDER — ATOMOXETINE HCL 25 MG PO CAPS
25.0000 mg | ORAL_CAPSULE | Freq: Every morning | ORAL | 0 refills | Status: DC
  Filled 2021-08-03: qty 30, 30d supply, fill #0

## 2021-08-03 MED FILL — Norgestimate & Ethinyl Estradiol Tab 0.25 MG-35 MCG: ORAL | 84 days supply | Qty: 84 | Fill #2 | Status: AC

## 2021-08-24 ENCOUNTER — Emergency Department (HOSPITAL_COMMUNITY): Payer: No Typology Code available for payment source | Admitting: Anesthesiology

## 2021-08-24 ENCOUNTER — Emergency Department (HOSPITAL_COMMUNITY): Payer: No Typology Code available for payment source

## 2021-08-24 ENCOUNTER — Encounter (HOSPITAL_BASED_OUTPATIENT_CLINIC_OR_DEPARTMENT_OTHER): Payer: Self-pay

## 2021-08-24 ENCOUNTER — Emergency Department (HOSPITAL_BASED_OUTPATIENT_CLINIC_OR_DEPARTMENT_OTHER): Payer: No Typology Code available for payment source

## 2021-08-24 ENCOUNTER — Observation Stay (HOSPITAL_BASED_OUTPATIENT_CLINIC_OR_DEPARTMENT_OTHER)
Admission: EM | Admit: 2021-08-24 | Discharge: 2021-08-26 | Disposition: A | Discharge status: Home / Self Care | Payer: No Typology Code available for payment source | Attending: Internal Medicine | Admitting: Internal Medicine

## 2021-08-24 ENCOUNTER — Encounter (HOSPITAL_COMMUNITY)
Admission: EM | Disposition: A | Discharge status: Home / Self Care | Payer: Self-pay | Source: Home / Self Care | Attending: Emergency Medicine

## 2021-08-24 ENCOUNTER — Other Ambulatory Visit: Payer: Self-pay

## 2021-08-24 ENCOUNTER — Inpatient Hospital Stay: Admit: 2021-08-24 | Payer: No Typology Code available for payment source | Admitting: Urology

## 2021-08-24 DIAGNOSIS — K219 Gastro-esophageal reflux disease without esophagitis: Secondary | ICD-10-CM | POA: Diagnosis present

## 2021-08-24 DIAGNOSIS — N390 Urinary tract infection, site not specified: Secondary | ICD-10-CM | POA: Diagnosis not present

## 2021-08-24 DIAGNOSIS — Z20822 Contact with and (suspected) exposure to covid-19: Secondary | ICD-10-CM | POA: Insufficient documentation

## 2021-08-24 DIAGNOSIS — E782 Mixed hyperlipidemia: Secondary | ICD-10-CM | POA: Diagnosis present

## 2021-08-24 DIAGNOSIS — A419 Sepsis, unspecified organism: Secondary | ICD-10-CM | POA: Diagnosis not present

## 2021-08-24 DIAGNOSIS — N201 Calculus of ureter: Secondary | ICD-10-CM | POA: Diagnosis not present

## 2021-08-24 DIAGNOSIS — E039 Hypothyroidism, unspecified: Secondary | ICD-10-CM | POA: Insufficient documentation

## 2021-08-24 DIAGNOSIS — Z79899 Other long term (current) drug therapy: Secondary | ICD-10-CM | POA: Diagnosis not present

## 2021-08-24 DIAGNOSIS — N200 Calculus of kidney: Secondary | ICD-10-CM

## 2021-08-24 DIAGNOSIS — R6510 Systemic inflammatory response syndrome (SIRS) of non-infectious origin without acute organ dysfunction: Secondary | ICD-10-CM | POA: Diagnosis present

## 2021-08-24 HISTORY — PX: CYSTOSCOPY W/ URETERAL STENT PLACEMENT: SHX1429

## 2021-08-24 LAB — CBC
HCT: 40.8 % (ref 36.0–46.0)
Hemoglobin: 13.5 g/dL (ref 12.0–15.0)
MCH: 28 pg (ref 26.0–34.0)
MCHC: 33.1 g/dL (ref 30.0–36.0)
MCV: 84.6 fL (ref 80.0–100.0)
Platelets: 244 10*3/uL (ref 150–400)
RBC: 4.82 MIL/uL (ref 3.87–5.11)
RDW: 12.4 % (ref 11.5–15.5)
WBC: 13.3 10*3/uL — ABNORMAL HIGH (ref 4.0–10.5)
nRBC: 0 % (ref 0.0–0.2)

## 2021-08-24 LAB — URINALYSIS, ROUTINE W REFLEX MICROSCOPIC
Bilirubin Urine: NEGATIVE
Glucose, UA: NEGATIVE mg/dL
Ketones, ur: NEGATIVE mg/dL
Nitrite: NEGATIVE
Protein, ur: 100 mg/dL — AB
Specific Gravity, Urine: 1.017 (ref 1.005–1.030)
pH: 6 (ref 5.0–8.0)

## 2021-08-24 LAB — LACTIC ACID, PLASMA: Lactic Acid, Venous: 0.7 mmol/L (ref 0.5–1.9)

## 2021-08-24 LAB — BASIC METABOLIC PANEL
Anion gap: 11 (ref 5–15)
BUN: 13 mg/dL (ref 6–20)
CO2: 23 mmol/L (ref 22–32)
Calcium: 9.3 mg/dL (ref 8.9–10.3)
Chloride: 101 mmol/L (ref 98–111)
Creatinine, Ser: 0.95 mg/dL (ref 0.44–1.00)
GFR, Estimated: >60 mL/min (ref >60)
Glucose, Bld: 109 mg/dL — ABNORMAL HIGH (ref 70–99)
Potassium: 3.8 mmol/L (ref 3.5–5.1)
Sodium: 135 mmol/L (ref 135–145)

## 2021-08-24 LAB — PREGNANCY, URINE: Preg Test, Ur: NEGATIVE

## 2021-08-24 LAB — RESP PANEL BY RT-PCR (FLU A&B, COVID) ARPGX2
Influenza A by PCR: NEGATIVE
Influenza B by PCR: NEGATIVE
SARS Coronavirus 2 by RT PCR: NEGATIVE

## 2021-08-24 SURGERY — CYSTOSCOPY, WITH RETROGRADE PYELOGRAM AND URETERAL STENT INSERTION
Anesthesia: General | Site: Urethra | Laterality: Right

## 2021-08-24 MED ORDER — OXYCODONE HCL 5 MG/5ML PO SOLN: 5.0000 mg | Freq: Once | ORAL | Status: DC | PRN

## 2021-08-24 MED ORDER — ACETAMINOPHEN 325 MG PO TABS
650.0000 mg | ORAL_TABLET | Freq: Once | ORAL | Status: AC
  Administered 2021-08-24: 650 mg via ORAL
  Filled 2021-08-24: qty 2

## 2021-08-24 MED ORDER — SODIUM CHLORIDE 0.9 % IV BOLUS
1000.0000 mL | Freq: Once | INTRAVENOUS | Status: AC
  Administered 2021-08-24: 1000 mL via INTRAVENOUS

## 2021-08-24 MED ORDER — MIDAZOLAM HCL 5 MG/5ML IJ SOLN
INTRAMUSCULAR | Status: DC | PRN
  Administered 2021-08-24: 2 mg via INTRAVENOUS

## 2021-08-24 MED ORDER — BISACODYL 10 MG RE SUPP
10.0000 mg | Freq: Every day | RECTAL | Status: DC | PRN
  Filled 2021-08-24: qty 1

## 2021-08-24 MED ORDER — HYDROXYZINE HCL 25 MG PO TABS
25.0000 mg | ORAL_TABLET | Freq: Three times a day (TID) | ORAL | Status: DC | PRN
  Filled 2021-08-24: qty 1

## 2021-08-24 MED ORDER — FENTANYL CITRATE PF 50 MCG/ML IJ SOSY: 25.0000 ug | PREFILLED_SYRINGE | INTRAMUSCULAR | Status: DC | PRN

## 2021-08-24 MED ORDER — LEVOTHYROXINE SODIUM 112 MCG PO TABS
112.0000 ug | ORAL_TABLET | Freq: Every day | ORAL | Status: DC
  Administered 2021-08-25 – 2021-08-26 (×2): 112 ug via ORAL
  Filled 2021-08-24 (×2): qty 1

## 2021-08-24 MED ORDER — FENTANYL CITRATE (PF) 100 MCG/2ML IJ SOLN
INTRAMUSCULAR | Status: DC | PRN
  Administered 2021-08-24: 50 ug via INTRAVENOUS

## 2021-08-24 MED ORDER — SUCCINYLCHOLINE CHLORIDE 200 MG/10ML IV SOSY
PREFILLED_SYRINGE | INTRAVENOUS | Status: DC | PRN
  Administered 2021-08-24: 140 mg via INTRAVENOUS

## 2021-08-24 MED ORDER — SODIUM CHLORIDE 0.9 % IV SOLN
2.0000 g | Freq: Once | INTRAVENOUS | Status: AC
  Administered 2021-08-24: 2 g via INTRAVENOUS
  Filled 2021-08-24: qty 20

## 2021-08-24 MED ORDER — ATOMOXETINE HCL 25 MG PO CAPS
25.0000 mg | ORAL_CAPSULE | Freq: Every morning | ORAL | Status: DC
  Filled 2021-08-24 (×2): qty 1

## 2021-08-24 MED ORDER — SODIUM CHLORIDE 0.9 % IV SOLN
1.0000 g | INTRAVENOUS | Status: DC
  Administered 2021-08-25: 1 g via INTRAVENOUS
  Filled 2021-08-24: qty 10

## 2021-08-24 MED ORDER — ONDANSETRON HCL 4 MG/2ML IJ SOLN: 4.0000 mg | INTRAMUSCULAR | Status: DC | PRN

## 2021-08-24 MED ORDER — PANTOPRAZOLE SODIUM 40 MG PO TBEC
40.0000 mg | DELAYED_RELEASE_TABLET | Freq: Every day | ORAL | Status: DC
  Administered 2021-08-25 – 2021-08-26 (×2): 40 mg via ORAL
  Filled 2021-08-24 (×2): qty 1

## 2021-08-24 MED ORDER — NORGESTIMATE-ETH ESTRADIOL 0.25-35 MG-MCG PO TABS: 1.0000 | ORAL_TABLET | Freq: Every day | ORAL | Status: DC

## 2021-08-24 MED ORDER — ACETAMINOPHEN 10 MG/ML IV SOLN
INTRAVENOUS | Status: AC
  Filled 2021-08-24: qty 100

## 2021-08-24 MED ORDER — OXYCODONE HCL 5 MG PO TABS: 5.0000 mg | ORAL_TABLET | ORAL | Status: DC | PRN

## 2021-08-24 MED ORDER — ESCITALOPRAM OXALATE 10 MG PO TABS
10.0000 mg | ORAL_TABLET | Freq: Every day | ORAL | Status: DC
  Administered 2021-08-25 (×2): 10 mg via ORAL
  Filled 2021-08-24 (×2): qty 1

## 2021-08-24 MED ORDER — ACETAMINOPHEN 10 MG/ML IV SOLN
1000.0000 mg | Freq: Once | INTRAVENOUS | Status: AC
  Administered 2021-08-24: 1000 mg via INTRAVENOUS

## 2021-08-24 MED ORDER — LIDOCAINE 2% (20 MG/ML) 5 ML SYRINGE
INTRAMUSCULAR | Status: DC | PRN
  Administered 2021-08-24: 60 mg via INTRAVENOUS

## 2021-08-24 MED ORDER — PROPOFOL 10 MG/ML IV BOLUS
INTRAVENOUS | Status: DC | PRN
  Administered 2021-08-24: 30 mg via INTRAVENOUS
  Administered 2021-08-24: 170 mg via INTRAVENOUS

## 2021-08-24 MED ORDER — CLONAZEPAM 0.5 MG PO TABS: 0.5000 mg | ORAL_TABLET | Freq: Two times a day (BID) | ORAL | Status: DC | PRN

## 2021-08-24 MED ORDER — IOHEXOL 300 MG/ML  SOLN
100.0000 mL | Freq: Once | INTRAMUSCULAR | Status: AC | PRN
  Administered 2021-08-24: 80 mL via INTRAVENOUS

## 2021-08-24 MED ORDER — FLEET ENEMA 7-19 GM/118ML RE ENEM
1.0000 | ENEMA | Freq: Once | RECTAL | Status: DC | PRN
  Filled 2021-08-24: qty 1

## 2021-08-24 MED ORDER — STERILE WATER FOR IRRIGATION IR SOLN
Status: DC | PRN
  Administered 2021-08-24: 3000 mL

## 2021-08-24 MED ORDER — DEXAMETHASONE SODIUM PHOSPHATE 10 MG/ML IJ SOLN
INTRAMUSCULAR | Status: DC | PRN
  Administered 2021-08-24: 10 mg via INTRAVENOUS

## 2021-08-24 MED ORDER — LACTATED RINGERS IV SOLN: INTRAVENOUS | Status: DC

## 2021-08-24 MED ORDER — ONDANSETRON HCL 4 MG/2ML IJ SOLN
INTRAMUSCULAR | Status: DC | PRN
  Administered 2021-08-24: 4 mg via INTRAVENOUS

## 2021-08-24 MED ORDER — KETOROLAC TROMETHAMINE 15 MG/ML IJ SOLN
15.0000 mg | Freq: Once | INTRAMUSCULAR | Status: AC
  Administered 2021-08-24: 15 mg via INTRAVENOUS
  Filled 2021-08-24: qty 1

## 2021-08-24 MED ORDER — OXYCODONE HCL 5 MG PO TABS: 5.0000 mg | ORAL_TABLET | Freq: Once | ORAL | Status: DC | PRN

## 2021-08-24 MED ORDER — ENOXAPARIN SODIUM 40 MG/0.4ML IJ SOSY
40.0000 mg | PREFILLED_SYRINGE | INTRAMUSCULAR | Status: DC
  Administered 2021-08-25: 40 mg via SUBCUTANEOUS
  Filled 2021-08-24 (×2): qty 0.4

## 2021-08-24 MED ORDER — IOHEXOL 300 MG/ML  SOLN
INTRAMUSCULAR | Status: DC | PRN
  Administered 2021-08-24: 10 mL

## 2021-08-24 MED ORDER — HYDROMORPHONE HCL 1 MG/ML IJ SOLN: 0.5000 mg | INTRAMUSCULAR | Status: DC | PRN

## 2021-08-24 MED ORDER — ROCURONIUM BROMIDE 10 MG/ML (PF) SYRINGE
PREFILLED_SYRINGE | INTRAVENOUS | Status: DC | PRN
  Administered 2021-08-24: 40 mg via INTRAVENOUS

## 2021-08-24 MED ORDER — SENNOSIDES-DOCUSATE SODIUM 8.6-50 MG PO TABS
1.0000 | ORAL_TABLET | Freq: Every evening | ORAL | Status: DC | PRN
  Filled 2021-08-24: qty 1

## 2021-08-24 MED ORDER — SUGAMMADEX SODIUM 500 MG/5ML IV SOLN
INTRAVENOUS | Status: DC | PRN
  Administered 2021-08-24: 500 mg via INTRAVENOUS

## 2021-08-24 MED ORDER — DICYCLOMINE HCL 20 MG PO TABS
20.0000 mg | ORAL_TABLET | Freq: Three times a day (TID) | ORAL | Status: DC | PRN
  Filled 2021-08-24: qty 1

## 2021-08-24 MED ORDER — ONDANSETRON HCL 4 MG/2ML IJ SOLN
4.0000 mg | Freq: Once | INTRAMUSCULAR | Status: AC
  Administered 2021-08-24: 4 mg via INTRAVENOUS
  Filled 2021-08-24: qty 2

## 2021-08-24 MED ORDER — POTASSIUM CHLORIDE IN NACL 20-0.45 MEQ/L-% IV SOLN
INTRAVENOUS | Status: DC
  Filled 2021-08-24: qty 1000

## 2021-08-24 MED ORDER — AMISULPRIDE (ANTIEMETIC) 5 MG/2ML IV SOLN: 10.0000 mg | Freq: Once | INTRAVENOUS | Status: DC | PRN

## 2021-08-24 MED ORDER — PROMETHAZINE HCL 25 MG/ML IJ SOLN: 6.2500 mg | INTRAMUSCULAR | Status: DC | PRN

## 2021-08-24 MED ORDER — ACETAMINOPHEN 325 MG PO TABS
650.0000 mg | ORAL_TABLET | ORAL | Status: DC | PRN
  Administered 2021-08-25 – 2021-08-26 (×5): 650 mg via ORAL
  Filled 2021-08-24 (×4): qty 2

## 2021-08-24 SURGICAL SUPPLY — 11 items
BAG URO CATCHER STRL LF (MISCELLANEOUS) ×3 IMPLANT
CATH URETL OPEN 5X70 (CATHETERS) ×3 IMPLANT
CLOTH BEACON ORANGE TIMEOUT ST (SAFETY) ×3 IMPLANT
GLOVE SURG POLYISO LF SZ8 (GLOVE) ×3 IMPLANT
GOWN STRL REUS W/TWL XL LVL3 (GOWN DISPOSABLE) ×3 IMPLANT
GUIDEWIRE STR DUAL SENSOR (WIRE) ×3 IMPLANT
MANIFOLD NEPTUNE II (INSTRUMENTS) ×3 IMPLANT
PACK CYSTO (CUSTOM PROCEDURE TRAY) ×3 IMPLANT
TUBING CONNECTING 10 (TUBING) ×2 IMPLANT
TUBING CONNECTING 10' (TUBING) ×1
TUBING UROLOGY SET (TUBING) IMPLANT

## 2021-08-24 NOTE — Anesthesia Preprocedure Evaluation (Addendum)
Anesthesia Evaluation  Patient identified by MRN, date of birth, ID band Patient awake    Reviewed: Allergy & Precautions, NPO status , Patient's Chart, lab work & pertinent test results  Airway Mallampati: II  TM Distance: >3 FB Neck ROM: Full    Dental no notable dental hx.    Pulmonary neg pulmonary ROS,    Pulmonary exam normal breath sounds clear to auscultation       Cardiovascular Exercise Tolerance: Good negative cardio ROS Normal cardiovascular exam Rhythm:Regular Rate:Normal     Neuro/Psych PSYCHIATRIC DISORDERS Anxiety Depression negative neurological ROS     GI/Hepatic Neg liver ROS, GERD  Medicated,  Endo/Other  Hypothyroidism   Renal/GU Renal diseasenephrolithiasis  negative genitourinary   Musculoskeletal negative musculoskeletal ROS (+)   Abdominal   Peds negative pediatric ROS (+)  Hematology negative hematology ROS (+)   Anesthesia Other Findings   Reproductive/Obstetrics negative OB ROS                            Anesthesia Physical Anesthesia Plan  ASA: 2 and emergent  Anesthesia Plan: General   Post-op Pain Management:    Induction: Intravenous and Rapid sequence  PONV Risk Score and Plan: 3 and Treatment may vary due to age or medical condition and Ondansetron  Airway Management Planned: Oral ETT  Additional Equipment: None  Intra-op Plan:   Post-operative Plan: Extubation in OR  Informed Consent: I have reviewed the patients History and Physical, chart, labs and discussed the procedure including the risks, benefits and alternatives for the proposed anesthesia with the patient or authorized representative who has indicated his/her understanding and acceptance.     Dental advisory given  Plan Discussed with: Anesthesiologist and CRNA  Anesthesia Plan Comments:        Anesthesia Quick Evaluation

## 2021-08-24 NOTE — Anesthesia Postprocedure Evaluation (Signed)
Anesthesia Post Note  Patient: Alicia Stokes  Procedure(s) Performed: CYSTOSCOPY WITH RETROGRADE PYELOGRAM (Right: Urethra)     Patient location during evaluation: PACU Anesthesia Type: General Level of consciousness: sedated Pain management: pain level controlled Vital Signs Assessment: post-procedure vital signs reviewed and stable Respiratory status: spontaneous breathing and respiratory function stable Cardiovascular status: stable Postop Assessment: no apparent nausea or vomiting Anesthetic complications: no   No notable events documented.  Last Vitals:  Vitals:   08/24/21 2141 08/24/21 2145  BP:  126/74  Pulse:  (!) 110  Resp:  (!) 29  Temp:    SpO2: 91% 95%    Last Pain:  Vitals:   08/24/21 1918  TempSrc:   PainSc: 3                  Candra R Abdoulie Tierce

## 2021-08-24 NOTE — ED Triage Notes (Signed)
Patient here POV from Home with Pelvic Pain and Bilateral Flank Pain.  Patient went to UC yesterday and states Pelvic Exam and UA were mostly unremarkable. Urinary Urgency.  Patient here today due to Pain and Fever today (101.7).  NAD Noted during Triage. A&Ox4. GCS 15. Ambulatory.

## 2021-08-24 NOTE — ED Notes (Signed)
Patient transported to CT 

## 2021-08-24 NOTE — Anesthesia Procedure Notes (Addendum)
Procedure Name: Intubation Date/Time: 08/24/2021 9:00 PM Performed by: Gerald Leitz, CRNA Pre-anesthesia Checklist: Patient identified, Patient being monitored, Timeout performed, Emergency Drugs available and Suction available Patient Re-evaluated:Patient Re-evaluated prior to induction Oxygen Delivery Method: Circle system utilized Preoxygenation: Pre-oxygenation with 100% oxygen Induction Type: IV induction and Rapid sequence Ventilation: Mask ventilation without difficulty Laryngoscope Size: Mac and 3 Grade View: Grade I Tube type: Oral Tube size: 7.0 mm Number of attempts: 1 Airway Equipment and Method: Stylet Placement Confirmation: ETT inserted through vocal cords under direct vision, positive ETCO2 and breath sounds checked- equal and bilateral Secured at: 22 cm Tube secured with: Tape Dental Injury: Teeth and Oropharynx as per pre-operative assessment

## 2021-08-24 NOTE — ED Provider Notes (Signed)
[facility] EMERGENCY DEPT Provider Note   CSN: [id number] Arrival date & time: 08/24/21  1301     History Chief Complaint  Patient presents with   Flank Pain    Alicia Stokes is a 40 y.o. female.  This is a 40 y.o. female with significant medical history as below, including CIN 1, hypothyroidism who presents to the ED with complaint of pelvic/abd pain x2-3 days.   Location:  RLQ, right flank Duration:  3 days Onset:  gradual Timing:  constant, worsening Description:  sharp, aching pain to abdomen. Severity:  mild Exacerbating/Alleviating Factors:  worse after eating dinner last night Associated Symptoms:  nausea with emesis x1, fevers, dysuria, increased urinary frequency, intermittent constipation Pertinent Negatives:  no sick contacts, no concern for STI, no upper abdominal pain, no IVDU  Context: 2-3 days of abdominal pain, fever, nausea, symptoms worsening since onset. Seen at [facility] yesterday told workup was neg, pelvic completed and was told was WNL. No medications prior to arrival.    The history is provided by the patient. No language interpreter was used.  Flank Pain Associated symptoms include abdominal pain. Pertinent negatives include no chest pain, no headaches and no shortness of breath.      Past Medical History:  Diagnosis Date   Abdominal wall mass    Abnormal pap 2010 ascus    Abnormal Pap smear    Anxiety 2011   Bacterial infection    Bladder infection    CIN I (cervical intraepithelial neoplasia I) 2010   Depression 2011   GERD (gastroesophageal reflux disease)    hx of    H/O urinary frequency 2005   History of chicken pox    History of PCOS    Hypothyroidism    Polycystic disease, ovaries    Stress incontinence    Yeast infection     Patient Active Problem List   Diagnosis Date Noted   Right ureteral stone 08/24/2021   Obesity 03/13/2018   Hypothyroidism 03/13/2018   Gastroesophageal reflux disease 03/13/2018    Abnormal cervical Papanicolaou smear 03/13/2018   Vaginal delivery 12/23/2014   Preterm premature rupture of membranes (PPROM) with onset of labor within 24 hours of rupture in third trimester, antepartum 12/22/2014   Abnormal MSAFP (maternal serum alpha-fetoprotein), elevated 09/18/2014   Female factor infertility 08/22/2014   Dysplasia of cervix, low grade (CIN 1) 02/16/2012   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/2012    Past Surgical History:  Procedure Laterality Date   COLPOSCOPY  2010   DILATION AND EVACUATION N/A 12/28/2013   Procedure: DILATATION AND EVACUATION;  Surgeon: [name], MD;  Location: [facility];  Service: Gynecology;  Laterality: N/A;   EXCISION OF ABDOMINAL WALL TUMOR N/A 10/26/2017   Procedure: RE-EXCISION ABDOMINAL WALL DESMOID TUMOR WITH ADVANCEMENT FLAP CLOSURE;  Surgeon: Stark Klein, MD;  Location: [facility];  Service: General;  Laterality: N/A;  GENERAL AND LOCAL   INSERTION OF MESH N/A 10/26/2017   Procedure: INSERTION OF MESH;  Surgeon: Stark Klein, MD;  Location: [facility];  Service: General;  Laterality: N/A;   invitro  11/06/13   MASS EXCISION N/A 09/02/2017   Procedure: REMOVAL ABDOMINAL WALL MASS;  Surgeon: Jackolyn Confer, MD;  Location: [facility];  Service: General;  Laterality: N/A;  GENERAL AND LMA    NO PAST SURGERIES     ivf    5 years   [place] EXTRACTION  2000  OB History     Gravida  2   Para  1   Term  0   Preterm  1   AB  1   Living  1      SAB  1   IAB  0   Ectopic  0   Multiple  0   Live Births  1           Family History  Problem Relation Age of Onset   Depression Mother    Hyperthyroidism Father    Lung disease Maternal Grandmother 52       has one lung due to tb   Diabetes Maternal Grandfather    Hypertension Maternal Grandfather    COPD Paternal Grandmother     Social History   Tobacco Use   Smoking status: Never   Smokeless tobacco: Never  Vaping Use    Vaping Use: Never used  Substance Use Topics   Alcohol use: Yes    Alcohol/week: 12.0 standard drinks    Types: 10 Cans of beer, 2 Standard drinks or equivalent per week    Comment: occ   Drug use: No    Home Medications Prior to Admission medications   Medication Sig Start Date End Date Taking? Authorizing Provider  atomoxetine (STRATTERA) 25 MG capsule Take 1 capsule (25 mg total) by mouth in the morning. 08/03/21     calcium carbonate (TUMS - DOSED IN MG ELEMENTAL CALCIUM) 500 MG chewable tablet Chew 2 tablets by mouth daily as needed for indigestion or heartburn.     [provider]  clonazePAM (KLONOPIN) 0.5 MG tablet Take 1 tablet (0.5 mg total) by mouth 2 (two) times daily as needed for anxiety 04/03/21     dicyclomine (BENTYL) 20 MG tablet Take 1 tablet (20 mg total) by mouth 2 (two) times daily. 02/09/19   Tasia Catchings, Amy V, PA-C  dicyclomine (BENTYL) 20 MG tablet TAKE 1 TABLET BY MOUTH 3 TIMES DAILY AS NEEDED 01/14/21 01/14/22  Kristen Loader, FNP  escitalopram (LEXAPRO) 10 MG tablet Take 10 mg daily by mouth.    [provider]  escitalopram (LEXAPRO) 10 MG tablet Take 1 tablet (10 mg total) by mouth daily. 03/25/21     escitalopram (LEXAPRO) 20 MG tablet TAKE 1 TABLET BY MOUTH DAILY 10/09/20 10/09/21  Kristen Loader, FNP  esomeprazole (NEXIUM) 20 MG capsule TAKE 1 CAPSULE BY MOUTH ONCE DAILY 04/25/20 04/25/21  Kristen Loader, FNP  esomeprazole (NEXIUM) 20 MG capsule Take 1 capsule (20 mg total) by mouth daily. 04/03/21     hydrOXYzine (ATARAX/VISTARIL) 25 MG tablet Take 1 tablet (25 mg total) by mouth every 8 (eight) hours as needed for anxiety or sleep. 03/25/21     levothyroxine (SYNTHROID) 112 MCG tablet Take 1 tablet (112 mcg total) by mouth daily. 04/03/21     levothyroxine (SYNTHROID, LEVOTHROID) 112 MCG tablet Take 112 mcg daily before breakfast by mouth.    [provider]  Multiple Vitamins-Minerals (MULTIVITAMIN PO) Take 1 tablet by mouth daily.    [provider]  naproxen sodium (ALEVE) 220 MG tablet Take 440 mg by mouth daily as needed (for pain or headache).    [provider]  norgestimate-ethinyl estradiol (ORTHO-CYCLEN) 0.25-35 MG-MCG tablet TAKE 1 TABLET BY MOUTH ONCE DAILY 11/13/20 11/13/21  Donnel Saxon, CNM  norgestimate-ethinyl estradiol (ORTHO-CYCLEN) 0.25-35 MG-MCG tablet TAKE 1 TABLET BY MOUTH ONCE DAILY 08/21/20 08/21/21  Donnel Saxon, CNM  penicillin v potassium (VEETID) 500 MG tablet  Take 1 tablet (500 mg total) by mouth 3 (three) times daily for 7 days. 04/13/21       Allergies    Patient has no known allergies.  Review of Systems   Review of Systems  Constitutional:  Positive for fatigue and fever. Negative for activity change.  HENT:  Negative for facial swelling and trouble swallowing.   Eyes:  Negative for discharge and redness.  Respiratory:  Negative for cough and shortness of breath.   Cardiovascular:  Negative for chest pain and palpitations.  Gastrointestinal:  Positive for abdominal pain, nausea and vomiting.  Genitourinary:  Positive for dysuria, flank pain and frequency.  Musculoskeletal:  Negative for back pain and gait problem.  Skin:  Negative for pallor and rash.  Neurological:  Negative for syncope and headaches.   Physical Exam Updated Vital Signs BP 124/77 (BP Location: Right Arm)   Pulse 81   Temp 98.9 F (37.2 C)   Resp (!) 26   Ht 5\' 6"  (1.676 m)   Wt 83.4 kg   SpO2 96%   BMI 29.68 kg/m   Physical Exam Vitals and nursing note reviewed.  Constitutional:      General: She is not in acute distress.    Appearance: Normal appearance. She is not ill-appearing.  HENT:     Head: Normocephalic and atraumatic.     Right Ear: External ear normal.     Left Ear: External ear normal.     Nose: Nose normal.     Mouth/Throat:     Mouth: Mucous membranes are moist.  Eyes:     General: No scleral icterus.       Right eye: No discharge.        Left eye: No discharge.   Cardiovascular:     Rate and Rhythm: Normal rate and regular rhythm.     Pulses: Normal pulses.     Heart sounds: Normal heart sounds.  Pulmonary:     Effort: Pulmonary effort is normal. No respiratory distress.     Breath sounds: Normal breath sounds.  Abdominal:     General: Abdomen is flat.     Tenderness: There is abdominal tenderness in the right lower quadrant.     Comments: Mild ttp to rlq, non-peritoneal abdomen, no rebound   Musculoskeletal:        General: Normal range of motion.     Cervical back: Normal range of motion.     Right lower leg: No edema.     Left lower leg: No edema.  Skin:    General: Skin is warm and dry.     Capillary Refill: Capillary refill takes less than 2 seconds.  Neurological:     Mental Status: She is alert.  Psychiatric:        Mood and Affect: Mood normal.        Behavior: Behavior normal.    ED Results / Procedures / Treatments   Labs (all labs ordered are listed, but only abnormal results are displayed) Labs Reviewed  URINALYSIS, ROUTINE W REFLEX MICROSCOPIC - Abnormal; Notable for the following components:      Result Value   APPearance HAZY (*)    Hgb urine dipstick MODERATE (*)    Protein, ur 100 (*)    Leukocytes,Ua LARGE (*)    Bacteria, UA RARE (*)    All other components within normal limits  BASIC METABOLIC PANEL - Abnormal; Notable for the following components:   Glucose, Bld 109 (*)  All other components within normal limits  CBC - Abnormal; Notable for the following components:   WBC 13.3 (*)    All other components within normal limits  RESP PANEL BY RT-PCR (FLU A&B, COVID) ARPGX2  URINE CULTURE  CULTURE, BLOOD (ROUTINE X 2)  CULTURE, BLOOD (ROUTINE X 2)  URINE CULTURE  PREGNANCY, URINE  LACTIC ACID, PLASMA  LACTIC ACID, PLASMA  CBC  CREATININE, SERUM  HIV ANTIBODY (ROUTINE TESTING W REFLEX)  BASIC METABOLIC PANEL  CBC    EKG None  Radiology CT ABDOMEN PELVIS W CONTRAST  Result Date:  08/24/2021 CLINICAL DATA:  Urinary frequency.  Fever.  Low pelvic pain. EXAM: CT ABDOMEN AND PELVIS WITH CONTRAST TECHNIQUE: Multidetector CT imaging of the abdomen and pelvis was performed using the standard protocol following bolus administration of intravenous contrast. CONTRAST:  18mL OMNIPAQUE IOHEXOL 300 MG/ML  SOLN COMPARISON:  07/05/2017. FINDINGS: Lower chest: Unremarkable. Hepatobiliary: Diffuse low density of the liver relative to the spleen. Possible small, noncalcified gallstones in the gallbladder. No gallbladder wall thickening or pericholecystic fluid. Pancreas: Unremarkable. No pancreatic ductal dilatation or surrounding inflammatory changes. Spleen: Normal in size without focal abnormality. Adrenals/Urinary Tract: Normal appearing adrenal glands. Mild dilatation of the right renal collecting system and ureter to the level of a 5 mm calculus at the ureterovesical junction. Mild diffuse mucosal thickening and enhancement involving the right renal collecting system and ureter. Normal appearing left kidney and ureter. Stomach/Bowel: Stomach is within normal limits. Appendix appears normal. No evidence of bowel wall thickening, distention, or inflammatory changes. Vascular/Lymphatic: No significant vascular findings are present. No enlarged abdominal or pelvic lymph nodes. Reproductive: Uterus and bilateral adnexa are unremarkable. Other: No abdominal wall hernia or abnormality. No abdominopelvic ascites. Musculoskeletal: Minimal lower thoracic and upper lumbar spine degenerative spur formation. IMPRESSION: 1. 5 mm distal right ureteral calculus at the ureterovesical junction, causing mild right hydronephrosis and hydroureter. 2. Mild diffuse mucosal thickening and enhancement involving the right renal collecting system and ureter, compatible with obstructive infectious changes. 3. Possible noncalcified cholelithiasis. 4. Diffuse hepatic steatosis. Electronically Signed   By: Claudie Revering M.D.   On:  08/24/2021 18:06   DG C-Arm 1-60 Min-No Report  Result Date: 08/24/2021 Fluoroscopy was utilized by the requesting physician.  No radiographic interpretation.    Procedures .Critical Care Performed by: Jeanell Sparrow, DO Authorized by: Jeanell Sparrow, DO   Critical care provider statement:    Critical care time (minutes):  37   Critical care time was exclusive of:  Separately billable procedures and treating other patients   Critical care was necessary to treat or prevent imminent or life-threatening deterioration of the following conditions:  Sepsis   Critical care was time spent personally by me on the following activities:  Ordering and performing treatments and interventions, ordering and review of laboratory studies, ordering and review of radiographic studies, pulse oximetry, re-evaluation of patient's condition, review of old charts, obtaining history from patient or surrogate, examination of patient, evaluation of patient's response to treatment and development of treatment plan with patient or surrogate   Care discussed with: accepting provider at another facility   Comments:     Sepsis requiring urgent OR intervention    Medications Ordered in ED Medications  lactated ringers infusion ( Intravenous Anesthesia Volume Adjustment 08/24/21 2128)  fentaNYL (SUBLIMAZE) injection 25-50 mcg (has no administration in time range)  oxyCODONE (Oxy IR/ROXICODONE) immediate release tablet 5 mg (has no administration in time range)    Or  oxyCODONE (ROXICODONE) 5 MG/5ML solution 5 mg (has no administration in time range)  promethazine (PHENERGAN) injection 6.25-12.5 mg (has no administration in time range)  amisulpride (BARHEMSYS) injection 10 mg (has no administration in time range)  atomoxetine (STRATTERA) capsule 25 mg (has no administration in time range)  clonazePAM (KLONOPIN) tablet 0.5 mg (has no administration in time range)  dicyclomine (BENTYL) tablet 20 mg (has no administration  in time range)  escitalopram (LEXAPRO) tablet 10 mg (10 mg Oral Given 08/25/21 0006)  pantoprazole (PROTONIX) EC tablet 40 mg (has no administration in time range)  hydrOXYzine (ATARAX/VISTARIL) tablet 25 mg (has no administration in time range)  levothyroxine (SYNTHROID) tablet 112 mcg (has no administration in time range)  norgestimate-ethinyl estradiol (ORTHO-CYCLEN) 0.25-35 MG-MCG tablet 1 tablet (has no administration in time range)  0.45 % NaCl with KCl 20 mEq / L infusion ( Intravenous New Bag/Given 08/24/21 2231)  acetaminophen (TYLENOL) tablet 650 mg (has no administration in time range)  oxyCODONE (Oxy IR/ROXICODONE) immediate release tablet 5 mg (has no administration in time range)  HYDROmorphone (DILAUDID) injection 0.5-1 mg (has no administration in time range)  senna-docusate (Senokot-S) tablet 1 tablet (has no administration in time range)  bisacodyl (DULCOLAX) suppository 10 mg (has no administration in time range)  sodium phosphate (FLEET) 7-19 GM/118ML enema 1 enema (has no administration in time range)  ondansetron (ZOFRAN) injection 4 mg (has no administration in time range)  enoxaparin (LOVENOX) injection 40 mg (has no administration in time range)  cefTRIAXone (ROCEPHIN) 1 g in sodium chloride 0.9 % 100 mL IVPB (has no administration in time range)  acetaminophen (TYLENOL) tablet 650 mg (650 mg Oral Given 08/24/21 1715)  sodium chloride 0.9 % bolus 1,000 mL (0 mLs Intravenous Stopped 08/24/21 1834)  cefTRIAXone (ROCEPHIN) 2 g in sodium chloride 0.9 % 100 mL IVPB (0 g Intravenous Stopped 08/24/21 1812)  ondansetron (ZOFRAN) injection 4 mg (4 mg Intravenous Given 08/24/21 1712)  ketorolac (TORADOL) 15 MG/ML injection 15 mg (15 mg Intravenous Given 08/24/21 1715)  iohexol (OMNIPAQUE) 300 MG/ML solution 100 mL (80 mLs Intravenous Contrast Given 08/24/21 1731)  acetaminophen (OFIRMEV) 10 MG/ML IV (  Duplicate 82/9/93 7169)  acetaminophen (OFIRMEV) IV 1,000 mg (0 mg Intravenous  Stopped 08/24/21 2159)    ED Course  I have reviewed the triage vital signs and the nursing notes.  Pertinent labs & imaging results that were available during my care of the patient were reviewed by me and considered in my medical decision making (see chart for details).    MDM Rules/Calculators/A&P                           CC: abd pain, flank pain, fever, urinary changes  This patient complains of above; this involves an extensive number of treatment options and is a complaint that carries with it a high risk of complications and morbidity. Vital signs were reviewed. Serious etiologies considered.  Record review:  Previous records obtained and reviewed   Work up as above, notable for:  Labs & imaging results that were available during my care of the patient were reviewed by me and considered in my medical decision making.   I ordered imaging studies which included CT a/p  and I independently visualized and interpreted imaging which showed obstructive stone.    UA consistent with infection, given systemic complaints there is concern for pyelonephritis. Will start IVF, antibiotics. Will obtain CT a/p as she has concurrent RLQ  pain, fevers, concern for pyelonephritis.   Management: IVF, zofran, toradol, tylenol  Reassessment:  Pt report sshe is feeling better.   Imaging reviewed, there is concern for septic stone.  Culture sent.  Blood culture sent.  Start patient on Rocephin.  Patient started IV fluids.  NPO.  D/w urology Dr. Jeffie Pollock who recommends transfer to [facility] for surgical evaluation of septic stone. Will transfer ED to ED. Dr Jeffie Pollock says he will call in the OR staff to prepare room; would recommend pt go directly to OR upon arrival if possible.   D/w Dr Francia Greaves at [facility] who accepts patient for transfer.    Pt updated and is agreeable to plan.          This chart was dictated using voice recognition software.  Despite best efforts to proofread,  errors can  occur which can change the documentation meaning.  Final Clinical Impression(s) / ED Diagnoses Final diagnoses:  Urinary tract infection without hematuria, site unspecified  Nephrolithiasis  Sepsis, due to unspecified organism, unspecified whether acute organ dysfunction present [facility])    Rx / DC Orders ED Discharge Orders     None        Jeanell Sparrow, DO 08/25/21 0024

## 2021-08-24 NOTE — Op Note (Signed)
Procedure: 1.  Cystoscopy with removal of bladder stone, simple. 2.  Cystoscopy with right retrograde pyelogram and interpretation. 3.  Application of fluoroscopy.  Preop diagnosis: Right UVJ stone with sepsis.  Postop diagnosis: Intraoperative passage of right UVJ stone.  Surgeon: Dr. Irine Seal.  Anesthesia: General.  Specimen: Urine culture and stone.  Drain: None.  EBL: None.  Complications: None.  Indications: The patient is a 40 year old female who was seen this evening the [facility] and found to have a 5 mm right UVJ stone with obstruction with fever and early sepsis.  It was felt that cystoscopy with stent placement was indicated.  Procedure: She had been given Rocephin in the ER.  She was taken operating room where a general anesthetic was induced.  She was placed in lithotomy position and fitted with PAS hose.  Her perineum and genitalia were prepped with Betadine solution.  She was draped in usual sterile fashion.  Following the prep and for draping she had a very strong rigor and actually required repositioning.  Cystoscopy was performed using a 21 [place] scope and 30 degree lens.  Examination of a normal urethra.  She had significant bladder descent with a cystocele.  There was some erythema in the mucosa surrounding the right ureteral orifice.  But the orifice was otherwise unremarkable as was the left ureteral orifice.  No other bladder wall lesions were identified.  In the middle of the posterior bladder her stone was identified and it was felt that this is likely expulsed during the anesthetic induction.  The stone was that evacuated from the bladder.  A right retrograde pyelogram was performed with a 6 [place] open-ended catheter and Omnipaque.  Right retrograde pyelogram demonstrated no additional filling defects and decompression of the kidney.  Because of the interval stone passage and the lack of purulent urine from the upper tract or significant edema  of the orifice it was felt that a stent was not indicated.  The bladder was drained and the cystoscope was removed.  She was taken down from lithotomy position, her anesthetic was reversed and she was moved recovery in stable condition.  There were no complications.

## 2021-08-24 NOTE — Sepsis Progress Note (Signed)
Following per sepsis protocol   

## 2021-08-24 NOTE — Transfer of Care (Signed)
Immediate Anesthesia Transfer of Care Note  Patient: Alicia Stokes  Procedure(s) Performed: Procedure(s): CYSTOSCOPY WITH RETROGRADE PYELOGRAM (Right)  Patient Location: PACU  Anesthesia Type:General  Level of Consciousness: Alert, Awake, Oriented  Airway & Oxygen Therapy: Patient Spontanous Breathing  Post-op Assessment: Report given to RN  Post vital signs: Reviewed and stable  Last Vitals:  Vitals:   08/24/21 1919 08/24/21 2132  BP: 131/85 134/81  Pulse: 93 (!) 110  Resp: 18 (!) 24  Temp: (!) 38.2 C (!) 39.9 C  SpO2: 06% 986%    Complications: No apparent anesthesia complications

## 2021-08-24 NOTE — Consult Note (Signed)
Subjective: 1. Urinary tract infection without hematuria, site unspecified   2. Nephrolithiasis   3. Sepsis, due to unspecified organism, unspecified whether acute organ dysfunction present [facility])      Consult requested by Dr. Wynona Dove  Alicia Stokes is a 40 yo female who presented to [place] with right flank pain and fever and she was found on CT to have an obstructing 95mm right UVJ stone.  She has a UA with rare bacteria and 21-50 WBC.  Her WBC is 13.3.  Her Tmax is 103.3 with tachycardia but no hypotension.  She has a history of bladder infections but no prior stones.  She had been seen in our office in 2019 for incontinence by Dr. Matilde Sprang.  She has had cystoscopy but no GU surgery.  ROS:  Review of Systems  Constitutional:  Positive for chills and fever.  Gastrointestinal:  Positive for constipation and nausea.  Genitourinary:  Positive for dysuria, flank pain and urgency.  All other systems reviewed and are negative.  No Known Allergies  Past Medical History:  Diagnosis Date   Abdominal wall mass    Abnormal pap 2010 ascus    Abnormal Pap smear    Anxiety 2011   Bacterial infection    Bladder infection    CIN I (cervical intraepithelial neoplasia I) 2010   Depression 2011   GERD (gastroesophageal reflux disease)    hx of    H/O urinary frequency 2005   History of chicken pox    History of PCOS    Hypothyroidism    Polycystic disease, ovaries    Stress incontinence    Yeast infection     Past Surgical History:  Procedure Laterality Date   COLPOSCOPY  2010   DILATION AND EVACUATION N/A 12/28/2013   Procedure: DILATATION AND EVACUATION;  Surgeon: [name], MD;  Location: [place];  Service: Gynecology;  Laterality: N/A;   EXCISION OF ABDOMINAL WALL TUMOR N/A 10/26/2017   Procedure: RE-EXCISION ABDOMINAL WALL DESMOID TUMOR WITH ADVANCEMENT FLAP CLOSURE;  Surgeon: Stark Klein, MD;  Location: [place];  Service: General;  Laterality: N/A;  GENERAL AND  LOCAL   INSERTION OF MESH N/A 10/26/2017   Procedure: INSERTION OF MESH;  Surgeon: Stark Klein, MD;  Location: [place];  Service: General;  Laterality: N/A;   invitro  11/06/13   MASS EXCISION N/A 09/02/2017   Procedure: REMOVAL ABDOMINAL WALL MASS;  Surgeon: Jackolyn Confer, MD;  Location: [place];  Service: General;  Laterality: N/A;  GENERAL AND LMA    NO PAST SURGERIES     ivf    5 years   WISDOM TOOTH EXTRACTION     WISDOM TOOTH EXTRACTION  2000    Social History   Socioeconomic History   Marital status: Married    Spouse name: Not on file   Number of children: Not on file   Years of education: Not on file   Highest education level: Not on file  Occupational History   Not on file  Tobacco Use   Smoking status: Never   Smokeless tobacco: Never  Vaping Use   Vaping Use: Never used  Substance and Sexual Activity   Alcohol use: Yes    Alcohol/week: 12.0 standard drinks    Types: 10 Cans of beer, 2 Standard drinks or equivalent per week    Comment: occ   Drug use: No   Sexual activity: Yes    Comment: pregnant  Other Topics Concern   Not on file  Social History Narrative   ** Merged History Encounter **       Social Determinants of Health   Financial Resource Strain: Not on file  Food Insecurity: Not on file  Transportation Needs: Not on file  Physical Activity: Not on file  Stress: Not on file  Social Connections: Not on file  Intimate Partner Violence: Not on file    Family History  Problem Relation Age of Onset   Depression Mother    Hyperthyroidism Father    Lung disease Maternal Grandmother 58       has one lung due to tb   Diabetes Maternal Grandfather    Hypertension Maternal Grandfather    COPD Paternal Grandmother     Anti-infectives: Anti-infectives (From admission, onward)    Start     Dose/Rate Route Frequency Ordered Stop   08/24/21 1700  cefTRIAXone (ROCEPHIN) 2 g in sodium chloride 0.9 % 100 mL IVPB        2 g 200  mL/hr over 30 Minutes Intravenous  Once 08/24/21 1646 08/24/21 1812       Current Facility-Administered Medications  Medication Dose Route Frequency Provider Last Rate Last Admin   lactated ringers infusion   Intravenous Continuous Wynona Dove A, DO 150 mL/hr at 08/24/21 1924 New Bag at 08/24/21 1924   Current Outpatient Medications  Medication Sig Dispense Refill   atomoxetine (STRATTERA) 25 MG capsule Take 1 capsule (25 mg total) by mouth in the morning. 30 capsule 0   calcium carbonate (TUMS - DOSED IN MG ELEMENTAL CALCIUM) 500 MG chewable tablet Chew 2 tablets by mouth daily as needed for indigestion or heartburn.      clonazePAM (KLONOPIN) 0.5 MG tablet Take 1 tablet (0.5 mg total) by mouth 2 (two) times daily as needed for anxiety 30 tablet 0   dicyclomine (BENTYL) 20 MG tablet Take 1 tablet (20 mg total) by mouth 2 (two) times daily. 20 tablet 0   dicyclomine (BENTYL) 20 MG tablet TAKE 1 TABLET BY MOUTH 3 TIMES DAILY AS NEEDED 90 tablet 0   escitalopram (LEXAPRO) 10 MG tablet Take 10 mg daily by mouth.     escitalopram (LEXAPRO) 10 MG tablet Take 1 tablet (10 mg total) by mouth daily. 90 tablet 3   escitalopram (LEXAPRO) 20 MG tablet TAKE 1 TABLET BY MOUTH DAILY 90 tablet 1   esomeprazole (NEXIUM) 20 MG capsule TAKE 1 CAPSULE BY MOUTH ONCE DAILY 90 capsule 1   esomeprazole (NEXIUM) 20 MG capsule Take 1 capsule (20 mg total) by mouth daily. 90 capsule 4   hydrOXYzine (ATARAX/VISTARIL) 25 MG tablet Take 1 tablet (25 mg total) by mouth every 8 (eight) hours as needed for anxiety or sleep. 60 tablet 0   levothyroxine (SYNTHROID) 112 MCG tablet Take 1 tablet (112 mcg total) by mouth daily. 90 tablet 4   levothyroxine (SYNTHROID, LEVOTHROID) 112 MCG tablet Take 112 mcg daily before breakfast by mouth.     Multiple Vitamins-Minerals (MULTIVITAMIN PO) Take 1 tablet by mouth daily.     naproxen sodium (ALEVE) 220 MG tablet Take 440 mg by mouth daily as needed (for pain or headache).      norgestimate-ethinyl estradiol (ORTHO-CYCLEN) 0.25-35 MG-MCG tablet TAKE 1 TABLET BY MOUTH ONCE DAILY 84 tablet 3   norgestimate-ethinyl estradiol (ORTHO-CYCLEN) 0.25-35 MG-MCG tablet TAKE 1 TABLET BY MOUTH ONCE DAILY 84 tablet 0   penicillin v potassium (VEETID) 500 MG tablet Take 1 tablet (500 mg total) by mouth 3 (three) times daily for 7  days. 21 tablet 0     Objective: Vital signs in last 24 hours: BP 131/85 (BP Location: Right Arm)   Pulse 93   Temp (!) 100.8 F (38.2 C)   Resp 18   Ht 5\' 6"  (1.676 m)   Wt 83.4 kg   SpO2 98%   BMI 29.68 kg/m   Intake/Output from previous day: No intake/output data recorded. Intake/Output this shift: No intake/output data recorded.   Physical Exam Vitals reviewed.  Constitutional:      Appearance: Normal appearance.  HENT:     Head: Normocephalic and atraumatic.  Cardiovascular:     Rate and Rhythm: Regular rhythm. Tachycardia present.     Heart sounds: Normal heart sounds.  Pulmonary:     Effort: Pulmonary effort is normal. No respiratory distress.  Abdominal:     Palpations: Abdomen is soft.     Tenderness: There is no abdominal tenderness. There is right CVA tenderness.  Musculoskeletal:        General: Normal range of motion.  Skin:    General: Skin is warm and dry.  Neurological:     General: No focal deficit present.     Mental Status: She is alert and oriented to person, place, and time.  Psychiatric:        Mood and Affect: Mood normal.        Behavior: Behavior normal.    Lab Results:  Results for orders placed or performed during the hospital encounter of 08/24/21 (from the past 24 hour(s))  Urinalysis, Routine w reflex microscopic Urine, Clean Catch     Status: Abnormal   Collection Time: 08/24/21  1:46 PM  Result Value Ref Range   Color, Urine YELLOW YELLOW   APPearance HAZY (A) CLEAR   Specific Gravity, Urine 1.017 1.005 - 1.030   pH 6.0 5.0 - 8.0   Glucose, UA NEGATIVE NEGATIVE mg/dL   Hgb urine dipstick  MODERATE (A) NEGATIVE   Bilirubin Urine NEGATIVE NEGATIVE   Ketones, ur NEGATIVE NEGATIVE mg/dL   Protein, ur 100 (A) NEGATIVE mg/dL   Nitrite NEGATIVE NEGATIVE   Leukocytes,Ua LARGE (A) NEGATIVE   RBC / HPF 0-5 0 - 5 RBC/hpf   WBC, UA 21-50 0 - 5 WBC/hpf   Bacteria, UA RARE (A) NONE SEEN   Squamous Epithelial / LPF 0-5 0 - 5   WBC Clumps PRESENT    Mucus PRESENT   Pregnancy, urine     Status: None   Collection Time: 08/24/21  1:46 PM  Result Value Ref Range   Preg Test, Ur NEGATIVE NEGATIVE  Basic metabolic panel     Status: Abnormal   Collection Time: 08/24/21  1:46 PM  Result Value Ref Range   Sodium 135 135 - 145 mmol/L   Potassium 3.8 3.5 - 5.1 mmol/L   Chloride 101 98 - 111 mmol/L   CO2 23 22 - 32 mmol/L   Glucose, Bld 109 (H) 70 - 99 mg/dL   BUN 13 6 - 20 mg/dL   Creatinine, Ser 0.95 0.44 - 1.00 mg/dL   Calcium 9.3 8.9 - 10.3 mg/dL   GFR, Estimated >60 >60 mL/min   Anion gap 11 5 - 15  CBC     Status: Abnormal   Collection Time: 08/24/21  1:46 PM  Result Value Ref Range   WBC 13.3 (H) 4.0 - 10.5 K/uL   RBC 4.82 3.87 - 5.11 MIL/uL   Hemoglobin 13.5 12.0 - 15.0 g/dL   HCT 40.8 36.0 - 46.0 %  MCV 84.6 80.0 - 100.0 fL   MCH 28.0 26.0 - 34.0 pg   MCHC 33.1 30.0 - 36.0 g/dL   RDW 12.4 11.5 - 15.5 %   Platelets 244 150 - 400 K/uL   nRBC 0.0 0.0 - 0.2 %  Resp Panel by RT-PCR (Flu A&B, Covid) Nasopharyngeal Swab     Status: None   Collection Time: 08/24/21  4:49 PM   Specimen: Nasopharyngeal Swab; Nasopharyngeal(NP) swabs in vial transport medium  Result Value Ref Range   SARS Coronavirus 2 by RT PCR NEGATIVE NEGATIVE   Influenza A by PCR NEGATIVE NEGATIVE   Influenza B by PCR NEGATIVE NEGATIVE    BMET Recent Labs    08/24/21 1346  NA 135  K 3.8  CL 101  CO2 23  GLUCOSE 109*  BUN 13  CREATININE 0.95  CALCIUM 9.3   PT/INR No results for input(s): LABPROT, INR in the last 72 hours. ABG No results for input(s): PHART, HCO3 in the last 72 hours.  Invalid  input(s): PCO2, PO2  Studies/Results: CT ABDOMEN PELVIS W CONTRAST  Result Date: 08/24/2021 CLINICAL DATA:  Urinary frequency.  Fever.  Low pelvic pain. EXAM: CT ABDOMEN AND PELVIS WITH CONTRAST TECHNIQUE: Multidetector CT imaging of the abdomen and pelvis was performed using the standard protocol following bolus administration of intravenous contrast. CONTRAST:  46mL OMNIPAQUE IOHEXOL 300 MG/ML  SOLN COMPARISON:  07/05/2017. FINDINGS: Lower chest: Unremarkable. Hepatobiliary: Diffuse low density of the liver relative to the spleen. Possible small, noncalcified gallstones in the gallbladder. No gallbladder wall thickening or pericholecystic fluid. Pancreas: Unremarkable. No pancreatic ductal dilatation or surrounding inflammatory changes. Spleen: Normal in size without focal abnormality. Adrenals/Urinary Tract: Normal appearing adrenal glands. Mild dilatation of the right renal collecting system and ureter to the level of a 5 mm calculus at the ureterovesical junction. Mild diffuse mucosal thickening and enhancement involving the right renal collecting system and ureter. Normal appearing left kidney and ureter. Stomach/Bowel: Stomach is within normal limits. Appendix appears normal. No evidence of bowel wall thickening, distention, or inflammatory changes. Vascular/Lymphatic: No significant vascular findings are present. No enlarged abdominal or pelvic lymph nodes. Reproductive: Uterus and bilateral adnexa are unremarkable. Other: No abdominal wall hernia or abnormality. No abdominopelvic ascites. Musculoskeletal: Minimal lower thoracic and upper lumbar spine degenerative spur formation. IMPRESSION: 1. 5 mm distal right ureteral calculus at the ureterovesical junction, causing mild right hydronephrosis and hydroureter. 2. Mild diffuse mucosal thickening and enhancement involving the right renal collecting system and ureter, compatible with obstructive infectious changes. 3. Possible noncalcified cholelithiasis.  4. Diffuse hepatic steatosis. Electronically Signed   By: Claudie Revering M.D.   On: 08/24/2021 18:06     Assessment/Plan: 33mm right UVJ stone with sepsis.   She will be transferred from [place] for admission to medicine and emergent cystoscopy with stent insertion.   She got Rocephin in the ER.  I have reviewed the risks of the procedure including bleeding, worsening infection, injury to urinary structures, need for secondary procedures, thrombotic events and anesthetic complications.           No follow-ups on file.    CC: Dr. Wynona Dove.      Irine Seal 08/24/2021 [phone]

## 2021-08-25 ENCOUNTER — Encounter (HOSPITAL_COMMUNITY): Payer: Self-pay | Admitting: Urology

## 2021-08-25 DIAGNOSIS — N390 Urinary tract infection, site not specified: Secondary | ICD-10-CM | POA: Diagnosis present

## 2021-08-25 DIAGNOSIS — F419 Anxiety disorder, unspecified: Secondary | ICD-10-CM | POA: Insufficient documentation

## 2021-08-25 DIAGNOSIS — K219 Gastro-esophageal reflux disease without esophagitis: Secondary | ICD-10-CM

## 2021-08-25 DIAGNOSIS — N201 Calculus of ureter: Secondary | ICD-10-CM

## 2021-08-25 DIAGNOSIS — E039 Hypothyroidism, unspecified: Secondary | ICD-10-CM

## 2021-08-25 DIAGNOSIS — A419 Sepsis, unspecified organism: Secondary | ICD-10-CM | POA: Diagnosis present

## 2021-08-25 DIAGNOSIS — F3340 Major depressive disorder, recurrent, in remission, unspecified: Secondary | ICD-10-CM | POA: Insufficient documentation

## 2021-08-25 DIAGNOSIS — E782 Mixed hyperlipidemia: Secondary | ICD-10-CM

## 2021-08-25 DIAGNOSIS — R6510 Systemic inflammatory response syndrome (SIRS) of non-infectious origin without acute organ dysfunction: Secondary | ICD-10-CM

## 2021-08-25 DIAGNOSIS — E559 Vitamin D deficiency, unspecified: Secondary | ICD-10-CM | POA: Insufficient documentation

## 2021-08-25 DIAGNOSIS — N393 Stress incontinence (female) (male): Secondary | ICD-10-CM | POA: Insufficient documentation

## 2021-08-25 LAB — HIV ANTIBODY (ROUTINE TESTING W REFLEX): HIV Screen 4th Generation wRfx: NONREACTIVE

## 2021-08-25 LAB — BASIC METABOLIC PANEL
Anion gap: 7 (ref 5–15)
BUN: 11 mg/dL (ref 6–20)
CO2: 24 mmol/L (ref 22–32)
Calcium: 8.6 mg/dL — ABNORMAL LOW (ref 8.9–10.3)
Chloride: 103 mmol/L (ref 98–111)
Creatinine, Ser: 0.82 mg/dL (ref 0.44–1.00)
GFR, Estimated: >60 mL/min (ref >60)
Glucose, Bld: 176 mg/dL — ABNORMAL HIGH (ref 70–99)
Potassium: 3.9 mmol/L (ref 3.5–5.1)
Sodium: 134 mmol/L — ABNORMAL LOW (ref 135–145)

## 2021-08-25 LAB — CBC
HCT: 41.3 % (ref 36.0–46.0)
Hemoglobin: 13.4 g/dL (ref 12.0–15.0)
MCH: 28.3 pg (ref 26.0–34.0)
MCHC: 32.4 g/dL (ref 30.0–36.0)
MCV: 87.3 fL (ref 80.0–100.0)
Platelets: 248 10*3/uL (ref 150–400)
RBC: 4.73 MIL/uL (ref 3.87–5.11)
RDW: 12.9 % (ref 11.5–15.5)
WBC: 15.2 10*3/uL — ABNORMAL HIGH (ref 4.0–10.5)
nRBC: 0 % (ref 0.0–0.2)

## 2021-08-25 MED ORDER — POLYETHYLENE GLYCOL 3350 17 G PO PACK
17.0000 g | PACK | Freq: Every day | ORAL | Status: DC | PRN
  Administered 2021-08-25: 17 g via ORAL
  Filled 2021-08-25 (×2): qty 1

## 2021-08-25 MED ORDER — LACTATED RINGERS IV SOLN: INTRAVENOUS | Status: AC

## 2021-08-25 MED ORDER — MORPHINE SULFATE (PF) 4 MG/ML IV SOLN: 4.0000 mg | INTRAVENOUS | Status: DC | PRN

## 2021-08-25 MED ORDER — MELATONIN 3 MG PO TABS
6.0000 mg | ORAL_TABLET | Freq: Every evening | ORAL | Status: DC | PRN
  Administered 2021-08-25: 6 mg via ORAL
  Filled 2021-08-25: qty 2

## 2021-08-25 MED ORDER — OXYCODONE-ACETAMINOPHEN 5-325 MG PO TABS
1.0000 | ORAL_TABLET | ORAL | Status: DC | PRN
  Administered 2021-08-26: 1 via ORAL
  Filled 2021-08-25: qty 1

## 2021-08-25 NOTE — Assessment & Plan Note (Addendum)
-   Patient presenting with right flank and abdominal pain found to have urinalysis suggestive of urinary tract infection - s/p cystoscopy with urology; stone evacuated in OR; no indication felt for stenting - see sepsis as well - discharged on cipro to complete course for Enterobacter cloacae

## 2021-08-25 NOTE — Assessment & Plan Note (Addendum)
-    continue Synthroid. 

## 2021-08-25 NOTE — Progress Notes (Signed)
1 Day Post-Op  Subjective: Alicia Stokes is doing well s/p cystoscopy.  She had passed her stone in the OR.   She spiked a temp post op but is afebrile this AM.  She denies pain.  ROS:  Review of Systems  Constitutional:  Negative for fever.  Gastrointestinal:  Negative for nausea.  Genitourinary:  Negative for flank pain.   Anti-infectives: Anti-infectives (From admission, onward)    Start     Dose/Rate Route Frequency Ordered Stop   08/25/21 1700  cefTRIAXone (ROCEPHIN) 1 g in sodium chloride 0.9 % 100 mL IVPB        1 g 200 mL/hr over 30 Minutes Intravenous Every 24 hours 08/24/21 2153 09/01/21 1659   08/24/21 1700  cefTRIAXone (ROCEPHIN) 2 g in sodium chloride 0.9 % 100 mL IVPB        2 g 200 mL/hr over 30 Minutes Intravenous  Once 08/24/21 1646 08/24/21 1812       Current Facility-Administered Medications  Medication Dose Route Frequency Provider Last Rate Last Admin   acetaminophen (TYLENOL) tablet 650 mg  650 mg Oral Q4H PRN Irine Seal, MD   650 mg at 08/25/21 0639   amisulpride (BARHEMSYS) injection 10 mg  10 mg Intravenous Once PRN Merlinda Frederick, MD       atomoxetine (STRATTERA) capsule 25 mg  25 mg Oral q AM Irine Seal, MD       cefTRIAXone (ROCEPHIN) 1 g in sodium chloride 0.9 % 100 mL IVPB  1 g Intravenous Q24H Irine Seal, MD       dicyclomine (BENTYL) tablet 20 mg  20 mg Oral TID PRN Irine Seal, MD       enoxaparin (LOVENOX) injection 40 mg  40 mg Subcutaneous Q24H Irine Seal, MD       escitalopram (LEXAPRO) tablet 10 mg  10 mg Oral Daily Irine Seal, MD   10 mg at 08/25/21 0006   hydrOXYzine (ATARAX/VISTARIL) tablet 25 mg  25 mg Oral Q8H PRN Irine Seal, MD       lactated ringers infusion   Intravenous Continuous Vernelle Emerald, MD 125 mL/hr at 08/25/21 0602 New Bag at 08/25/21 0602   levothyroxine (SYNTHROID) tablet 112 mcg  112 mcg Oral Daily Irine Seal, MD   112 mcg at 08/25/21 0554   oxyCODONE-acetaminophen (PERCOCET/ROXICET) 5-325 MG per tablet 1 tablet   1 tablet Oral Q4H PRN Vernelle Emerald, MD       Or   morphine 4 MG/ML injection 4 mg  4 mg Intravenous Q4H PRN Shalhoub, Sherryll Burger, MD       norgestimate-ethinyl estradiol (ORTHO-CYCLEN) 0.25-35 MG-MCG tablet 1 tablet  1 tablet Oral Daily Irine Seal, MD       ondansetron [facility]) injection 4 mg  4 mg Intravenous Q4H PRN Irine Seal, MD       pantoprazole (PROTONIX) EC tablet 40 mg  40 mg Oral Daily Irine Seal, MD       polyethylene glycol (MIRALAX / GLYCOLAX) packet 17 g  17 g Oral Daily PRN Vernelle Emerald, MD         Objective: Vital signs in last 24 hours: Temp:  [98.3 F (36.8 C)-103.8 F (39.9 C)] 98.3 F (36.8 C) (11/08 0559) Pulse Rate:  [57-115] 65 (11/08 0559) Resp:  [16-37] 18 (11/08 0559) BP: (114-140)/(61-94) 122/86 (11/08 0559) SpO2:  [91 %-100 %] 98 % (11/08 0559) Weight:  [83.4 kg] 83.4 kg (11/07 1339)  Intake/Output from previous day: 11/07 0701 - 11/08  0700 In: 3441.7 [P.O.:590; I.V.:1751.7; IV Piggyback:1100] Out: -  Intake/Output this shift: No intake/output data recorded.   Physical Exam Vitals reviewed.  Constitutional:      Appearance: Normal appearance.  Cardiovascular:     Rate and Rhythm: Normal rate and regular rhythm.  Pulmonary:     Effort: Pulmonary effort is normal. No respiratory distress.     Breath sounds: Normal breath sounds.  Abdominal:     Palpations: Abdomen is soft.     Tenderness: There is no abdominal tenderness.  Neurological:     Mental Status: She is alert.    Lab Results:  Recent Labs    08/24/21 1346 08/25/21 0533  WBC 13.3* 15.2*  HGB 13.5 13.4  HCT 40.8 41.3  PLT 244 248   BMET Recent Labs    08/24/21 1346 08/25/21 0533  NA 135 134*  K 3.8 3.9  CL 101 103  CO2 23 24  GLUCOSE 109* 176*  BUN 13 11  CREATININE 0.95 0.82  CALCIUM 9.3 8.6*   PT/INR No results for input(s): LABPROT, INR in the last 72 hours. ABG No results for input(s): PHART, HCO3 in the last 72 hours.  Invalid input(s): PCO2,  PO2  Studies/Results: CT ABDOMEN PELVIS W CONTRAST  Result Date: 08/24/2021 CLINICAL DATA:  Urinary frequency.  Fever.  Low pelvic pain. EXAM: CT ABDOMEN AND PELVIS WITH CONTRAST TECHNIQUE: Multidetector CT imaging of the abdomen and pelvis was performed using the standard protocol following bolus administration of intravenous contrast. CONTRAST:  83mL OMNIPAQUE IOHEXOL 300 MG/ML  SOLN COMPARISON:  07/05/2017. FINDINGS: Lower chest: Unremarkable. Hepatobiliary: Diffuse low density of the liver relative to the spleen. Possible small, noncalcified gallstones in the gallbladder. No gallbladder wall thickening or pericholecystic fluid. Pancreas: Unremarkable. No pancreatic ductal dilatation or surrounding inflammatory changes. Spleen: Normal in size without focal abnormality. Adrenals/Urinary Tract: Normal appearing adrenal glands. Mild dilatation of the right renal collecting system and ureter to the level of a 5 mm calculus at the ureterovesical junction. Mild diffuse mucosal thickening and enhancement involving the right renal collecting system and ureter. Normal appearing left kidney and ureter. Stomach/Bowel: Stomach is within normal limits. Appendix appears normal. No evidence of bowel wall thickening, distention, or inflammatory changes. Vascular/Lymphatic: No significant vascular findings are present. No enlarged abdominal or pelvic lymph nodes. Reproductive: Uterus and bilateral adnexa are unremarkable. Other: No abdominal wall hernia or abnormality. No abdominopelvic ascites. Musculoskeletal: Minimal lower thoracic and upper lumbar spine degenerative spur formation. IMPRESSION: 1. 5 mm distal right ureteral calculus at the ureterovesical junction, causing mild right hydronephrosis and hydroureter. 2. Mild diffuse mucosal thickening and enhancement involving the right renal collecting system and ureter, compatible with obstructive infectious changes. 3. Possible noncalcified cholelithiasis. 4. Diffuse  hepatic steatosis. Electronically Signed   By: Claudie Revering M.D.   On: 08/24/2021 18:06   DG C-Arm 1-60 Min-No Report  Result Date: 08/24/2021 Fluoroscopy was utilized by the requesting physician.  No radiographic interpretation.     Assessment and Plan: Right ureteral stone with sepsis.  She is improving s/p cystoscopy.  She had passed her stone under anesthesia and didn't need a stent.  I will send the stone for analysis.  IV abx pending culture results.         LOS: 0 days    Irine Seal 08/25/2021 [phone] Patient ID: Langston Masker, female   DOB: 02/14/1981, 40 y.o.   MRN: [id number]

## 2021-08-25 NOTE — Assessment & Plan Note (Signed)
·   Please see assessment and plan above °

## 2021-08-25 NOTE — Assessment & Plan Note (Signed)
.   Continuing home regimen of lipid lowering therapy.  

## 2021-08-25 NOTE — H&P (Signed)
History and Physical    Alicia Stokes TIW:[id number] DOB: [birth year] DOA: 08/24/2021  PCP: Kristen Loader, FNP  Patient coming from: Home via [facility]   Chief Complaint:  Chief Complaint  Patient presents with   Flank Pain     HPI:    40 year old female with past medical history of anxiety disorder, gastroesophageal reflux disease, depression, hypothyroidism who presented to [place] emergency department with complaints of pelvic pain and right flank pain.    Patient describes that approximately 3 days ago she began to experience right-sided abdominal pain.  This abdominal pain is sharp in quality, severe in intensity and radiating to the right flank.  Pain is worse with movement and associated with bouts of nausea and occasional vomiting.  Is also been associated with poor oral intake over the span of time.  Due to patient's severe symptoms she presented to a local urgent care clinic on 11/6 where she underwent a pelvic exam and urinalysis all of which were unremarkable.  Despite this negative work-up, patient continued to experience worsening discomfort over the following 24 hours and actually began to experience fevers as high as 101.7 degrees Fahrenheit morning of 11/8 which prompted the patient to present to [place] emergency department for evaluation.  Upon evaluation in the emergency department, patient underwent CT imaging of the abdomen and pelvis revealing a 5 mm distal right ureteral calculus in the utero vesicular junction with associated right hydronephrosis and hydroureter.  Urinalysis was somewhat suggestive of a urinary tract infection and therefore patient was given a dose of intravenous ceftriaxone.  Furthermore, patient was exhibiting multiple SIRS criteria.  ER provider discussed case with Dr. Jeffie Pollock.  Due to concerns for impending sepsis patient was transferred from emergency department to the emergency department and urgently taken to the  operating room for cystoscopy and stent placement upon arrival to [facility] long hospital.  Hospitalist group was additionally called to assess the patient for admission to the hospital upon arrival.   Review of Systems:   Review of Systems  Gastrointestinal:  Positive for abdominal pain, nausea and vomiting.  Genitourinary:  Positive for flank pain.  All other systems reviewed and are negative.  Past Medical History:  Diagnosis Date   Abdominal wall mass    Abnormal pap 2010 ascus    Abnormal Pap smear    Anxiety 2011   Bacterial infection    Bladder infection    CIN I (cervical intraepithelial neoplasia I) 2010   Depression 2011   GERD (gastroesophageal reflux disease)    hx of    H/O urinary frequency 2005   History of chicken pox    History of PCOS    Hypothyroidism    Polycystic disease, ovaries    Stress incontinence    Yeast infection     Past Surgical History:  Procedure Laterality Date   COLPOSCOPY  2010   DILATION AND EVACUATION N/A 12/28/2013   Procedure: DILATATION AND EVACUATION;  Surgeon: [name], MD;  Location: [facility];  Service: Gynecology;  Laterality: N/A;   EXCISION OF ABDOMINAL WALL TUMOR N/A 10/26/2017   Procedure: RE-EXCISION ABDOMINAL WALL DESMOID TUMOR WITH ADVANCEMENT FLAP CLOSURE;  Surgeon: Stark Klein, MD;  Location: [facility];  Service: General;  Laterality: N/A;  GENERAL AND LOCAL   INSERTION OF MESH N/A 10/26/2017   Procedure: INSERTION OF MESH;  Surgeon: Stark Klein, MD;  Location: [facility];  Service: General;  Laterality: N/A;   invitro  11/06/13  MASS EXCISION N/A 09/02/2017   Procedure: REMOVAL ABDOMINAL WALL MASS;  Surgeon: Jackolyn Confer, MD;  Location: [facility];  Service: General;  Laterality: N/A;  GENERAL AND LMA    NO PAST SURGERIES     ivf    5 years   [place] EXTRACTION  2000     reports that she has never smoked. She has never used smokeless tobacco. She reports current  alcohol use of about 12.0 standard drinks per week. She reports that she does not use drugs.  No Known Allergies  Family History  Problem Relation Age of Onset   Depression Mother    Hyperthyroidism Father    Lung disease Maternal Grandmother 71       has one lung due to tb   Diabetes Maternal Grandfather    Hypertension Maternal Grandfather    COPD Paternal Grandmother      Prior to Admission medications   Medication Sig Start Date End Date Taking? Authorizing Provider  atomoxetine (STRATTERA) 25 MG capsule Take 1 capsule (25 mg total) by mouth in the morning. 08/03/21     calcium carbonate (TUMS - DOSED IN MG ELEMENTAL CALCIUM) 500 MG chewable tablet Chew 2 tablets by mouth daily as needed for indigestion or heartburn.     [provider]  clonazePAM (KLONOPIN) 0.5 MG tablet Take 1 tablet (0.5 mg total) by mouth 2 (two) times daily as needed for anxiety 04/03/21     dicyclomine (BENTYL) 20 MG tablet Take 1 tablet (20 mg total) by mouth 2 (two) times daily. 02/09/19   Tasia Catchings, Amy V, PA-C  dicyclomine (BENTYL) 20 MG tablet TAKE 1 TABLET BY MOUTH 3 TIMES DAILY AS NEEDED 01/14/21 01/14/22  Kristen Loader, FNP  escitalopram (LEXAPRO) 10 MG tablet Take 10 mg daily by mouth.    [provider]  escitalopram (LEXAPRO) 10 MG tablet Take 1 tablet (10 mg total) by mouth daily. 03/25/21     escitalopram (LEXAPRO) 20 MG tablet TAKE 1 TABLET BY MOUTH DAILY 10/09/20 10/09/21  Kristen Loader, FNP  esomeprazole (NEXIUM) 20 MG capsule TAKE 1 CAPSULE BY MOUTH ONCE DAILY 04/25/20 04/25/21  Kristen Loader, FNP  esomeprazole (NEXIUM) 20 MG capsule Take 1 capsule (20 mg total) by mouth daily. 04/03/21     hydrOXYzine (ATARAX/VISTARIL) 25 MG tablet Take 1 tablet (25 mg total) by mouth every 8 (eight) hours as needed for anxiety or sleep. 03/25/21     levothyroxine (SYNTHROID) 112 MCG tablet Take 1 tablet (112 mcg total) by mouth daily. 04/03/21     levothyroxine (SYNTHROID, LEVOTHROID) 112 MCG tablet Take  112 mcg daily before breakfast by mouth.    [provider]  Multiple Vitamins-Minerals (MULTIVITAMIN PO) Take 1 tablet by mouth daily.    [provider]  naproxen sodium (ALEVE) 220 MG tablet Take 440 mg by mouth daily as needed (for pain or headache).    [provider]  norgestimate-ethinyl estradiol (ORTHO-CYCLEN) 0.25-35 MG-MCG tablet TAKE 1 TABLET BY MOUTH ONCE DAILY 11/13/20 11/13/21  Donnel Saxon, CNM  norgestimate-ethinyl estradiol (ORTHO-CYCLEN) 0.25-35 MG-MCG tablet TAKE 1 TABLET BY MOUTH ONCE DAILY 08/21/20 08/21/21  Donnel Saxon, CNM  penicillin v potassium (VEETID) 500 MG tablet Take 1 tablet (500 mg total) by mouth 3 (three) times daily for 7 days. 04/13/21       Physical Exam: Vitals:   08/24/21 2230 08/24/21 2322 08/24/21 2345 08/25/21 0132  BP: 123/68 124/77  114/61  Pulse: 94 81  (!) 57  Resp: (!) 26   20  Temp: (!) 102.6 F (39.2 C) 98.9 F (37.2 C)  98.3 F (36.8 C)  TempSrc:      SpO2: 97% 97% 96% 96%  Weight:      Height:        Constitutional: Awake alert and oriented x3, no associated distress.   Skin: no rashes, no lesions, poor skin turgor noted. Eyes: Pupils are equally reactive to light.  No evidence of scleral icterus or conjunctival pallor.  ENMT: dry mucous membranes noted.  Posterior pharynx clear of any exudate or lesions.   Neck: normal, supple, no masses, no thyromegaly.  No evidence of jugular venous distension.   Respiratory: clear to auscultation bilaterally, no wheezing, no crackles. Normal respiratory effort. No accessory muscle use.  Cardiovascular: Regular rate and rhythm, no murmurs / rubs / gallops. No extremity edema. 2+ pedal pulses. No carotid bruits.  Chest:   Nontender without crepitus or deformity.   Back:   Nontender without crepitus or deformity. Abdomen: Right sided abdominal tenderness, mild.  Abdomen is soft  No evidence of intra-abdominal masses.  Positive bowel sounds noted in all quadrants.    Musculoskeletal: No joint deformity upper and lower extremities. Good ROM, no contractures. Normal muscle tone.  Neurologic: CN 2-12 grossly intact. Sensation intact.  Patient moving all 4 extremities spontaneously.  Patient is following all commands.  Patient is responsive to verbal stimuli.   Psychiatric: Patient exhibits normal mood with appropriate affect.  Patient seems to possess insight as to their current situation.     Labs on Admission: I have personally reviewed following labs and imaging studies -   CBC: Recent Labs  Lab 08/24/21 1346  WBC 13.3*  HGB 13.5  HCT 40.8  MCV 84.6  PLT 836   Basic Metabolic Panel: Recent Labs  Lab 08/24/21 1346  NA 135  K 3.8  CL 101  CO2 23  GLUCOSE 109*  BUN 13  CREATININE 0.95  CALCIUM 9.3   GFR: Estimated Creatinine Clearance: 85.6 mL/min (by C-G formula based on SCr of 0.95 mg/dL). Liver Function Tests: No results for input(s): AST, ALT, ALKPHOS, BILITOT, PROT, ALBUMIN in the last 168 hours. No results for input(s): LIPASE, AMYLASE in the last 168 hours. No results for input(s): AMMONIA in the last 168 hours. Coagulation Profile: No results for input(s): INR, PROTIME in the last 168 hours. Cardiac Enzymes: No results for input(s): CKTOTAL, CKMB, CKMBINDEX, TROPONINI in the last 168 hours. BNP (last 3 results) No results for input(s): PROBNP in the last 8760 hours. HbA1C: No results for input(s): HGBA1C in the last 72 hours. CBG: No results for input(s): GLUCAP in the last 168 hours. Lipid Profile: No results for input(s): CHOL, HDL, LDLCALC, TRIG, CHOLHDL, LDLDIRECT in the last 72 hours. Thyroid Function Tests: No results for input(s): TSH, T4TOTAL, FREET4, T3FREE, THYROIDAB in the last 72 hours. Anemia Panel: No results for input(s): VITAMINB12, FOLATE, FERRITIN, TIBC, IRON, RETICCTPCT in the last 72 hours. Urine analysis:    Component Value Date/Time   COLORURINE YELLOW 08/24/2021 1346   APPEARANCEUR HAZY (A)  08/24/2021 1346   LABSPEC 1.017 08/24/2021 1346   PHURINE 6.0 08/24/2021 1346   GLUCOSEU NEGATIVE 08/24/2021 1346   HGBUR MODERATE (A) 08/24/2021 1346   BILIRUBINUR NEGATIVE 08/24/2021 1346   BILIRUBINUR Neg 05/25/2018 1642   KETONESUR NEGATIVE 08/24/2021 1346   PROTEINUR 100 (A) 08/24/2021 1346   UROBILINOGEN 0.2 05/25/2018 1642  NITRITE NEGATIVE 08/24/2021 1346   LEUKOCYTESUR LARGE (A) 08/24/2021 1346    Radiological Exams on Admission - Personally Reviewed: CT ABDOMEN PELVIS W CONTRAST  Result Date: 08/24/2021 CLINICAL DATA:  Urinary frequency.  Fever.  Low pelvic pain. EXAM: CT ABDOMEN AND PELVIS WITH CONTRAST TECHNIQUE: Multidetector CT imaging of the abdomen and pelvis was performed using the standard protocol following bolus administration of intravenous contrast. CONTRAST:  47mL OMNIPAQUE IOHEXOL 300 MG/ML  SOLN COMPARISON:  07/05/2017. FINDINGS: Lower chest: Unremarkable. Hepatobiliary: Diffuse low density of the liver relative to the spleen. Possible small, noncalcified gallstones in the gallbladder. No gallbladder wall thickening or pericholecystic fluid. Pancreas: Unremarkable. No pancreatic ductal dilatation or surrounding inflammatory changes. Spleen: Normal in size without focal abnormality. Adrenals/Urinary Tract: Normal appearing adrenal glands. Mild dilatation of the right renal collecting system and ureter to the level of a 5 mm calculus at the ureterovesical junction. Mild diffuse mucosal thickening and enhancement involving the right renal collecting system and ureter. Normal appearing left kidney and ureter. Stomach/Bowel: Stomach is within normal limits. Appendix appears normal. No evidence of bowel wall thickening, distention, or inflammatory changes. Vascular/Lymphatic: No significant vascular findings are present. No enlarged abdominal or pelvic lymph nodes. Reproductive: Uterus and bilateral adnexa are unremarkable. Other: No abdominal wall hernia or abnormality. No  abdominopelvic ascites. Musculoskeletal: Minimal lower thoracic and upper lumbar spine degenerative spur formation. IMPRESSION: 1. 5 mm distal right ureteral calculus at the ureterovesical junction, causing mild right hydronephrosis and hydroureter. 2. Mild diffuse mucosal thickening and enhancement involving the right renal collecting system and ureter, compatible with obstructive infectious changes. 3. Possible noncalcified cholelithiasis. 4. Diffuse hepatic steatosis. Electronically Signed   By: Claudie Revering M.D.   On: 08/24/2021 18:06   DG C-Arm 1-60 Min-No Report  Result Date: 08/24/2021 Fluoroscopy was utilized by the requesting physician.  No radiographic interpretation.     Assessment/Plan  * Complicated UTI (urinary tract infection) Patient presenting with right flank and abdominal pain found to have urinalysis suggestive of urinary tract infection Presentation further complicated by multiple SIRS criteria and right ureteral stone with high risk of impending sepsis Dr. Jeffie Pollock with urology has graciously taken the patient to the operating in expedited fashion and performed a cystoscopy with retrograde pyelogram and stone retrieval.  No stent was placed Continue to treat patient with intravenous ceftriaxone Hydrating patient with intravenous isotonic fluids Blood and urine cultures obtained Close clinical monitoring for any evidence of hemodynamic compromise Urology continuing to follow, their assistance is appreciated.  Right ureteral stone Please see assessment and plan above  SIRS (systemic inflammatory response syndrome) (HCC) Patient exhibiting multiple SIRS criteria including leukocytosis tachycardia tachypnea No evidence of organ dysfunction to confirm sepsis although patient is at high risk of progressing to sepsis Remainder of assessment and plan as above  Hypothyroidism Resume home regimen of Synthroid    Mixed hyperlipidemia Continuing home regimen of lipid lowering  therapy.   Gastro-esophageal reflux disease without esophagitis Continuing home regimen of daily PPI therapy.      Code Status:  Full code  code status decision has been confirmed with: patient Family Communication: deferred   Status is: Observation  The patient remains OBS appropriate and will d/c before 2 midnights.       Vernelle Emerald MD [facility] Pager [phone]  If 7PM-7AM, please contact night-coverage [email or web address] Use universal [place] password for that web site. If you do not have the password, please call the hospital operator.  08/25/2021, 5:39  AM    

## 2021-08-25 NOTE — Plan of Care (Signed)
Patient seen and rounded on this evening. Briefly, 40 year old female who presented with right-sided abdominal pain and right lower back pain.  Work-up ultimately revealed right ureterolithiasis at the UVJ causing right hydronephrosis and hydroureter.  There was concern for obstruction due to stone and she was admitted for evaluation with urology.  She underwent cystoscopy and had intraoperative passage of the right UVJ stone.  She was able to void well after the procedure.  Stone was sent for analysis and urine cultures were also obtained on admission.  She remains on Rocephin until cultures further update. She was resting comfortably in her room when seen and as noted, voiding well, eating well, and denied any further pain.  Dwyane Dee, MD [facility] 08/25/2021, 6:41 PM

## 2021-08-25 NOTE — Assessment & Plan Note (Addendum)
Continuing home regimen of daily PPI therapy.  

## 2021-08-25 NOTE — Assessment & Plan Note (Addendum)
-    Leukocytosis,  Tachycardia,  Tachypnea, urinary source - Ucx growing Enterobacter cloacae -Ambiguous sensitivity to third-generation cephalosporins but historically appears to be poor with further data reviewed - obtain EKG for baseline - start on cipro and complete 7 days cipro

## 2021-08-26 ENCOUNTER — Other Ambulatory Visit (HOSPITAL_COMMUNITY): Payer: Self-pay

## 2021-08-26 DIAGNOSIS — N390 Urinary tract infection, site not specified: Secondary | ICD-10-CM | POA: Diagnosis not present

## 2021-08-26 LAB — CBC WITH DIFFERENTIAL/PLATELET
Abs Immature Granulocytes: 0.03 10*3/uL (ref 0.00–0.07)
Basophils Absolute: 0 10*3/uL (ref 0.0–0.1)
Basophils Relative: 0 %
Eosinophils Absolute: 0 10*3/uL (ref 0.0–0.5)
Eosinophils Relative: 0 %
HCT: 38.9 % (ref 36.0–46.0)
Hemoglobin: 12.8 g/dL (ref 12.0–15.0)
Immature Granulocytes: 0 %
Lymphocytes Relative: 20 %
Lymphs Abs: 2 10*3/uL (ref 0.7–4.0)
MCH: 28.4 pg (ref 26.0–34.0)
MCHC: 32.9 g/dL (ref 30.0–36.0)
MCV: 86.3 fL (ref 80.0–100.0)
Monocytes Absolute: 0.6 10*3/uL (ref 0.1–1.0)
Monocytes Relative: 6 %
Neutro Abs: 7.4 10*3/uL (ref 1.7–7.7)
Neutrophils Relative %: 74 %
Platelets: 229 10*3/uL (ref 150–400)
RBC: 4.51 MIL/uL (ref 3.87–5.11)
RDW: 12.9 % (ref 11.5–15.5)
WBC: 10.2 10*3/uL (ref 4.0–10.5)
nRBC: 0 % (ref 0.0–0.2)

## 2021-08-26 LAB — BASIC METABOLIC PANEL
Anion gap: 8 (ref 5–15)
BUN: 9 mg/dL (ref 6–20)
CO2: 26 mmol/L (ref 22–32)
Calcium: 8.5 mg/dL — ABNORMAL LOW (ref 8.9–10.3)
Chloride: 103 mmol/L (ref 98–111)
Creatinine, Ser: 0.93 mg/dL (ref 0.44–1.00)
GFR, Estimated: >60 mL/min (ref >60)
Glucose, Bld: 119 mg/dL — ABNORMAL HIGH (ref 70–99)
Potassium: 3.6 mmol/L (ref 3.5–5.1)
Sodium: 137 mmol/L (ref 135–145)

## 2021-08-26 LAB — URINE CULTURE: Culture: 60000 — AB

## 2021-08-26 LAB — MAGNESIUM: Magnesium: 1.9 mg/dL (ref 1.7–2.4)

## 2021-08-26 MED ORDER — CIPROFLOXACIN HCL 500 MG PO TABS
500.0000 mg | ORAL_TABLET | Freq: Two times a day (BID) | ORAL | 0 refills | Status: AC
Start: 2021-08-26 — End: 2021-09-02
  Filled 2021-08-26: qty 14, 7d supply, fill #0

## 2021-08-26 MED ORDER — CIPROFLOXACIN HCL 500 MG PO TABS
500.0000 mg | ORAL_TABLET | Freq: Two times a day (BID) | ORAL | Status: DC
  Administered 2021-08-26: 500 mg via ORAL
  Filled 2021-08-26: qty 1

## 2021-08-26 NOTE — Discharge Summary (Signed)
Physician Discharge Summary   Patient name: Alicia Stokes  Admit date:     08/24/2021  Discharge date: 08/26/2021  Discharge Physician: Dwyane Dee   PCP: Kristen Loader, FNP   Recommendations at discharge: Follow up with urology   Discharge Diagnoses Principal Problem:   Complicated UTI (urinary tract infection) Active Problems:   Sepsis ([place])   Right ureteral stone   Hypothyroidism   Gastro-esophageal reflux disease without esophagitis   Mixed hyperlipidemia   Resolved Diagnoses Resolved Problems:   * No resolved hospital problems. *   Hospital Course    * Complicated UTI (urinary tract infection) - Patient presenting with right flank and abdominal pain found to have urinalysis suggestive of urinary tract infection - s/p cystoscopy with urology; stone evacuated in OR; no indication felt for stenting - see sepsis as well - discharged on cipro to complete course for Enterobacter cloacae  Sepsis ([place]) -  Leukocytosis,  Tachycardia,  Tachypnea, urinary source - Ucx growing Enterobacter cloacae -Ambiguous sensitivity to third-generation cephalosporins but historically appears to be poor with further data reviewed - obtain EKG for baseline - start on cipro and complete 7 days cipro  Right ureteral stone Please see assessment and plan above  Mixed hyperlipidemia Continuing home regimen of lipid lowering therapy.   Gastro-esophageal reflux disease without esophagitis Continuing home regimen of daily PPI therapy.   Hypothyroidism - continue Synthroid     Procedures performed: Cystoscopy with intraoperative passage of right UVJ stone, 08/24/2021  Condition at discharge: stable  Exam Physical Exam Constitutional:      Appearance: Normal appearance.  HENT:     Head: Normocephalic and atraumatic.     Mouth/Throat:     Mouth: Mucous membranes are moist.  Eyes:     Extraocular Movements: Extraocular movements intact.  Cardiovascular:     Rate and  Rhythm: Normal rate and regular rhythm.  Pulmonary:     Effort: Pulmonary effort is normal. No respiratory distress.     Breath sounds: Normal breath sounds.  Abdominal:     General: Bowel sounds are normal. There is no distension.     Palpations: Abdomen is soft.     Tenderness: There is no abdominal tenderness.  Musculoskeletal:        General: Normal range of motion.     Cervical back: Normal range of motion and neck supple.  Skin:    General: Skin is warm and dry.  Neurological:     General: No focal deficit present.     Mental Status: She is alert.  Psychiatric:        Mood and Affect: Mood normal.        Behavior: Behavior normal.     Disposition: Home  Discharge time: greater than 30 minutes.  Follow-up [facility]. Go to .   Specialty: Emergency Medicine Why: As needed, If symptoms worsen Contact information: [place] [id number] [place], [place], [place] .   Specialty: Family Medicine Contact information: [place] [address] [place] Follow up on 09/08/2021.   Why: 11:30 Contact information: [place] [phone]                Allergies as of 08/26/2021   No Known Allergies      Medication  List     STOP taking these medications    atomoxetine 25 MG capsule Commonly known as: STRATTERA   hydrOXYzine 25 MG tablet Commonly known as: ATARAX/VISTARIL   penicillin v potassium 500 MG tablet Commonly known as: VEETID       TAKE these medications    acetaminophen 500 MG tablet Commonly known as: TYLENOL Take 1,000 mg by mouth every 6 (six) hours as needed for moderate pain.   ciprofloxacin 500 MG tablet Commonly known as: CIPRO Take 1 tablet (500 mg total) by mouth 2 (two) times daily for 7 days.   clonazePAM 0.5 MG  tablet Commonly known as: KLONOPIN Take 1 tablet (0.5 mg total) by mouth 2 (two) times daily as needed for anxiety What changed: reasons to take this   dicyclomine 20 MG tablet Commonly known as: BENTYL Take 1 tablet (20 mg total) by mouth 2 (two) times daily. What changed:  when to take this reasons to take this Another medication with the same name was removed. Continue taking this medication, and follow the directions you see here.   escitalopram 10 MG tablet Commonly known as: LEXAPRO Take 10 mg daily by mouth. What changed: Another medication with the same name was removed. Continue taking this medication, and follow the directions you see here.   esomeprazole 20 MG capsule Commonly known as: Stephen 1 CAPSULE BY MOUTH ONCE DAILY What changed: Another medication with the same name was removed. Continue taking this medication, and follow the directions you see here.   ibuprofen 200 MG tablet Commonly known as: ADVIL Take 400 mg by mouth every 6 (six) hours as needed for mild pain.   levothyroxine 112 MCG tablet Commonly known as: SYNTHROID Take 1 tablet (112 mcg total) by mouth daily. What changed: Another medication with the same name was removed. Continue taking this medication, and follow the directions you see here.   loratadine 10 MG tablet Commonly known as: CLARITIN Take 10 mg by mouth daily.   norgestimate-ethinyl estradiol 0.25-35 MG-MCG tablet Commonly known as: ORTHO-CYCLEN Take 1 tablet by mouth daily. What changed: Another medication with the same name was removed. Continue taking this medication, and follow the directions you see here.   Vitamin D3 50 MCG (2000 UT) capsule Take 2,000 Units by mouth daily.        CT ABDOMEN PELVIS W CONTRAST  Result Date: 08/24/2021 CLINICAL DATA:  Urinary frequency.  Fever.  Low pelvic pain. EXAM: CT ABDOMEN AND PELVIS WITH CONTRAST TECHNIQUE: Multidetector CT imaging of the abdomen and pelvis was performed  using the standard protocol following bolus administration of intravenous contrast. CONTRAST:  79mL OMNIPAQUE IOHEXOL 300 MG/ML  SOLN COMPARISON:  07/05/2017. FINDINGS: Lower chest: Unremarkable. Hepatobiliary: Diffuse low density of the liver relative to the spleen. Possible small, noncalcified gallstones in the gallbladder. No gallbladder wall thickening or pericholecystic fluid. Pancreas: Unremarkable. No pancreatic ductal dilatation or surrounding inflammatory changes. Spleen: Normal in size without focal abnormality. Adrenals/Urinary Tract: Normal appearing adrenal glands. Mild dilatation of the right renal collecting system and ureter to the level of a 5 mm calculus at the ureterovesical junction. Mild diffuse mucosal thickening and enhancement involving the right renal collecting system and ureter. Normal appearing left kidney and ureter. Stomach/Bowel: Stomach is within normal limits. Appendix appears normal. No evidence of bowel wall thickening, distention, or inflammatory changes. Vascular/Lymphatic: No significant vascular findings are present. No enlarged abdominal or pelvic lymph nodes. Reproductive: Uterus and bilateral adnexa are unremarkable. Other: No abdominal wall  hernia or abnormality. No abdominopelvic ascites. Musculoskeletal: Minimal lower thoracic and upper lumbar spine degenerative spur formation. IMPRESSION: 1. 5 mm distal right ureteral calculus at the ureterovesical junction, causing mild right hydronephrosis and hydroureter. 2. Mild diffuse mucosal thickening and enhancement involving the right renal collecting system and ureter, compatible with obstructive infectious changes. 3. Possible noncalcified cholelithiasis. 4. Diffuse hepatic steatosis. Electronically Signed   By: Claudie Revering M.D.   On: 08/24/2021 18:06   DG C-Arm 1-60 Min-No Report  Result Date: 08/24/2021 Fluoroscopy was utilized by the requesting physician.  No radiographic interpretation.   Results for orders placed  or performed during the hospital encounter of 08/24/21  Urine Culture     Status: Abnormal   Collection Time: 08/24/21  1:46 PM   Specimen: Urine, Clean Catch  Result Value Ref Range Status   Specimen Description   Final    URINE, CLEAN CATCH Performed at [facility], [address], [place], [place] [address]    Special Requests   Final    NONE Performed at [facility], [address], [place], [place] [address]    Culture 60,000 COLONIES/mL ENTEROBACTER CLOACAE (A)  Final   Report Status 08/26/2021 FINAL  Final   Organism ID, Bacteria ENTEROBACTER CLOACAE (A)  Final      Susceptibility   Enterobacter cloacae - MIC*    CEFAZOLIN >=64 RESISTANT Resistant     CEFEPIME <=0.12 SENSITIVE Sensitive     CIPROFLOXACIN <=0.25 SENSITIVE Sensitive     GENTAMICIN <=1 SENSITIVE Sensitive     IMIPENEM 0.5 SENSITIVE Sensitive     NITROFURANTOIN 64 INTERMEDIATE Intermediate     TRIMETH/SULFA <=20 SENSITIVE Sensitive     PIP/TAZO <=4 SENSITIVE Sensitive     * 60,000 COLONIES/mL ENTEROBACTER CLOACAE  Resp Panel by RT-PCR (Flu A&B, Covid) Nasopharyngeal Swab     Status: None   Collection Time: 08/24/21  4:49 PM   Specimen: Nasopharyngeal Swab; Nasopharyngeal(NP) swabs in vial transport medium  Result Value Ref Range Status   SARS Coronavirus 2 by RT PCR NEGATIVE NEGATIVE Final    Comment: (NOTE) SARS-CoV-2 target nucleic acids are NOT DETECTED.  The SARS-CoV-2 RNA is generally detectable in upper respiratory specimens during the acute phase of infection. The lowest concentration of SARS-CoV-2 viral copies this assay can detect is 138 copies/mL. A negative result does not preclude SARS-Cov-2 infection and should not be used as the sole basis for treatment or other patient management decisions. A negative result may occur with  improper specimen collection/handling, submission of specimen other than nasopharyngeal swab, presence of viral mutation(s)  within the areas targeted by this assay, and inadequate number of viral copies(<138 copies/mL). A negative result must be combined with clinical observations, patient history, and epidemiological information. The expected result is Negative.  Fact Sheet for Patients:  EntrepreneurPulse.com.au  Fact Sheet for Healthcare Providers:  IncredibleEmployment.be  This test is no t yet approved or cleared by the [place] FDA and  has been authorized for detection and/or diagnosis of SARS-CoV-2 by FDA under an Emergency Use Authorization (EUA). This EUA will remain  in effect (meaning this test can be used) for the duration of the COVID-19 declaration under Section 564(b)(1) of the Act, 21 U.S.C.section 360bbb-3(b)(1), unless the authorization is terminated  or revoked sooner.       Influenza A by PCR NEGATIVE NEGATIVE Final   Influenza B by PCR NEGATIVE NEGATIVE Final    Comment: (NOTE) The Xpert Xpress SARS-CoV-2/FLU/RSV plus assay is  intended as an aid in the diagnosis of influenza from Nasopharyngeal swab specimens and should not be used as a sole basis for treatment. Nasal washings and aspirates are unacceptable for Xpert Xpress SARS-CoV-2/FLU/RSV testing.  Fact Sheet for Patients: EntrepreneurPulse.com.au  Fact Sheet for Healthcare Providers: IncredibleEmployment.be  This test is not yet approved or cleared by the [place] FDA and has been authorized for detection and/or diagnosis of SARS-CoV-2 by FDA under an Emergency Use Authorization (EUA). This EUA will remain in effect (meaning this test can be used) for the duration of the COVID-19 declaration under Section 564(b)(1) of the Act, 21 U.S.C. section 360bbb-3(b)(1), unless the authorization is terminated or revoked.  Performed at [facility], [address], [place], [place] [address]   Blood culture (routine x 2)      Status: None (Preliminary result)   Collection Time: 08/24/21  7:15 PM   Specimen: BLOOD  Result Value Ref Range Status   Specimen Description   Final    BLOOD RIGHT HAND Performed at [facility], [address], [place], [place] [address]    Special Requests   Final    BOTTLES DRAWN AEROBIC AND ANAEROBIC Blood Culture adequate volume Performed at [facility], [address], [place], [place] [address]    Culture   Final    NO GROWTH < 24 HOURS Performed at [facility], [place] [address] [place], [place] [address]    Report Status PENDING  Incomplete  Urine Culture     Status: None (Preliminary result)   Collection Time: 08/24/21  9:19 PM   Specimen: Urine, Cystoscope  Result Value Ref Range Status   Specimen Description   Final    CYSTOSCOPY URINE Performed at [facility] [address], [place], [place] [address]    Special Requests   Final    NONE Performed at [facility], [place] [address] [place], [place] [address]    Culture   Final    CULTURE REINCUBATED FOR BETTER GROWTH Performed at [facility], [place] [address], [place], [place] [address]    Report Status PENDING  Incomplete  Blood culture (routine x 2)     Status: None (Preliminary result)   Collection Time: 08/25/21  3:05 PM   Specimen: BLOOD LEFT HAND  Result Value Ref Range Status   Specimen Description   Final    BLOOD LEFT HAND Performed at [facility] [address], [place], [place] [address]    Special Requests   Final    BOTTLES DRAWN AEROBIC AND ANAEROBIC Blood Culture adequate volume Performed at [facility] [address], [place], [place] [address]    Culture   Final    NO GROWTH < 12 HOURS Performed at [facility] [address], [place], [place] [address]    Report Status PENDING  Incomplete    Signed:  Dwyane Dee MD.  [facility] 08/26/2021, 5:35 PM

## 2021-08-26 NOTE — Progress Notes (Signed)
2 Days Post-Op  Subjective: Alicia Stokes is complaining of fever and chills as well as myalgias this morning and her temp has spiked to 102.8 on Rocephin.   Her culture is growing enterobacter that is resistant to ancef but sens to cefipime.  Ceftriaxone was not tested.   The leukocytosis has resolved.   ROS:  Review of Systems  Constitutional:  Positive for chills and fever.  Genitourinary:  Positive for flank pain (mild left).  Musculoskeletal:  Positive for myalgias.   Anti-infectives: Anti-infectives (From admission, onward)    Start     Dose/Rate Route Frequency Ordered Stop   08/25/21 1700  cefTRIAXone (ROCEPHIN) 1 g in sodium chloride 0.9 % 100 mL IVPB        1 g 200 mL/hr over 30 Minutes Intravenous Every 24 hours 08/24/21 2153 09/01/21 1659   08/24/21 1700  cefTRIAXone (ROCEPHIN) 2 g in sodium chloride 0.9 % 100 mL IVPB        2 g 200 mL/hr over 30 Minutes Intravenous  Once 08/24/21 1646 08/24/21 1812       Current Facility-Administered Medications  Medication Dose Route Frequency Provider Last Rate Last Admin   acetaminophen (TYLENOL) tablet 650 mg  650 mg Oral Q4H PRN Irine Seal, MD   650 mg at 08/26/21 0435   atomoxetine (STRATTERA) capsule 25 mg  25 mg Oral q AM Irine Seal, MD       cefTRIAXone (ROCEPHIN) 1 g in sodium chloride 0.9 % 100 mL IVPB  1 g Intravenous Q24H Irine Seal, MD 200 mL/hr at 08/25/21 1609 1 g at 08/25/21 1609   dicyclomine (BENTYL) tablet 20 mg  20 mg Oral TID PRN Irine Seal, MD       enoxaparin (LOVENOX) injection 40 mg  40 mg Subcutaneous Q24H Irine Seal, MD   40 mg at 08/25/21 1403   escitalopram (LEXAPRO) tablet 10 mg  10 mg Oral Daily Irine Seal, MD   10 mg at 08/25/21 2209   hydrOXYzine (ATARAX/VISTARIL) tablet 25 mg  25 mg Oral Q8H PRN Irine Seal, MD       levothyroxine (SYNTHROID) tablet 112 mcg  112 mcg Oral Daily Irine Seal, MD   112 mcg at 08/26/21 4128   melatonin tablet 6 mg  6 mg Oral QHS PRN Dwyane Dee, MD   6 mg at 08/25/21  2209   oxyCODONE-acetaminophen (PERCOCET/ROXICET) 5-325 MG per tablet 1 tablet  1 tablet Oral Q4H PRN Vernelle Emerald, MD   1 tablet at 08/26/21 0451   Or   morphine 4 MG/ML injection 4 mg  4 mg Intravenous Q4H PRN Shalhoub, Sherryll Burger, MD       norgestimate-ethinyl estradiol (ORTHO-CYCLEN) 0.25-35 MG-MCG tablet 1 tablet  1 tablet Oral Daily Irine Seal, MD       ondansetron [facility]) injection 4 mg  4 mg Intravenous Q4H PRN Irine Seal, MD       pantoprazole (PROTONIX) EC tablet 40 mg  40 mg Oral Daily Irine Seal, MD   40 mg at 08/25/21 1403   polyethylene glycol (MIRALAX / GLYCOLAX) packet 17 g  17 g Oral Daily PRN Vernelle Emerald, MD   17 g at 08/25/21 2210     Objective: Vital signs in last 24 hours: Temp:  [98.1 F (36.7 C)-102.5 F (39.2 C)] 98.1 F (36.7 C) (11/09 0656) Pulse Rate:  [60-89] 89 (11/09 0428) Resp:  [15-18] 18 (11/09 0428) BP: (109-134)/(64-84) 129/76 (11/09 0428) SpO2:  [96 %-100 %] 98 % (11/09  5638) Weight:  [94.6 kg] 94.6 kg (11/08 1446)  Intake/Output from previous day: 11/08 0701 - 11/09 0700 In: 2053.2 [P.O.:120; I.V.:1933.2] Out: -  Intake/Output this shift: No intake/output data recorded.   Physical Exam  Lab Results:  Recent Labs    08/25/21 0533 08/26/21 0439  WBC 15.2* 10.2  HGB 13.4 12.8  HCT 41.3 38.9  PLT 248 229   BMET Recent Labs    08/25/21 0533 08/26/21 0439  NA 134* 137  K 3.9 3.6  CL 103 103  CO2 24 26  GLUCOSE 176* 119*  BUN 11 9  CREATININE 0.82 0.93  CALCIUM 8.6* 8.5*   PT/INR No results for input(s): LABPROT, INR in the last 72 hours. ABG No results for input(s): PHART, HCO3 in the last 72 hours.  Invalid input(s): PCO2, PO2  Studies/Results: CT ABDOMEN PELVIS W CONTRAST  Result Date: 08/24/2021 CLINICAL DATA:  Urinary frequency.  Fever.  Low pelvic pain. EXAM: CT ABDOMEN AND PELVIS WITH CONTRAST TECHNIQUE: Multidetector CT imaging of the abdomen and pelvis was performed using the standard protocol  following bolus administration of intravenous contrast. CONTRAST:  37mL OMNIPAQUE IOHEXOL 300 MG/ML  SOLN COMPARISON:  07/05/2017. FINDINGS: Lower chest: Unremarkable. Hepatobiliary: Diffuse low density of the liver relative to the spleen. Possible small, noncalcified gallstones in the gallbladder. No gallbladder wall thickening or pericholecystic fluid. Pancreas: Unremarkable. No pancreatic ductal dilatation or surrounding inflammatory changes. Spleen: Normal in size without focal abnormality. Adrenals/Urinary Tract: Normal appearing adrenal glands. Mild dilatation of the right renal collecting system and ureter to the level of a 5 mm calculus at the ureterovesical junction. Mild diffuse mucosal thickening and enhancement involving the right renal collecting system and ureter. Normal appearing left kidney and ureter. Stomach/Bowel: Stomach is within normal limits. Appendix appears normal. No evidence of bowel wall thickening, distention, or inflammatory changes. Vascular/Lymphatic: No significant vascular findings are present. No enlarged abdominal or pelvic lymph nodes. Reproductive: Uterus and bilateral adnexa are unremarkable. Other: No abdominal wall hernia or abnormality. No abdominopelvic ascites. Musculoskeletal: Minimal lower thoracic and upper lumbar spine degenerative spur formation. IMPRESSION: 1. 5 mm distal right ureteral calculus at the ureterovesical junction, causing mild right hydronephrosis and hydroureter. 2. Mild diffuse mucosal thickening and enhancement involving the right renal collecting system and ureter, compatible with obstructive infectious changes. 3. Possible noncalcified cholelithiasis. 4. Diffuse hepatic steatosis. Electronically Signed   By: Claudie Revering M.D.   On: 08/24/2021 18:06   DG C-Arm 1-60 Min-No Report  Result Date: 08/24/2021 Fluoroscopy was utilized by the requesting physician.  No radiographic interpretation.     Assessment and Plan: Right ureteral stone with  sepsis.   Despite the stone being passed and being on Rocephin she continues to spike fever.   I will defer antibiotic adjustment per hospitalist.   She will f/u in my office on 09/08/21.   Stone sent for analysis in my office.     LOS: 0 days    Irine Seal 08/26/2021 [phone] Patient ID: Alicia Stokes, female   DOB: 04-24-1981, 40 y.o.   MRN: [id number]

## 2021-08-26 NOTE — Progress Notes (Signed)
Baseline EKG performed. First dose Cipro given an monitored for side effects. Reviewed DC instructions, including medications, indication, dosing and side effects. Reviewed what to do if symptoms worsen and follow up care. All questions answered. IV removed. Pt collected all belongings. Awaiting pick up

## 2021-08-27 LAB — URINE CULTURE: Culture: 1000 — AB

## 2021-08-30 LAB — CULTURE, BLOOD (ROUTINE X 2)
Culture: NO GROWTH
Culture: NO GROWTH
Special Requests: ADEQUATE
Special Requests: ADEQUATE

## 2021-09-01 ENCOUNTER — Other Ambulatory Visit (HOSPITAL_COMMUNITY): Payer: Self-pay

## 2021-09-01 MED ORDER — BUPROPION HCL ER (XL) 150 MG PO TB24
150.0000 mg | ORAL_TABLET | Freq: Every morning | ORAL | 0 refills | Status: AC
  Filled 2021-09-01: qty 30, 30d supply, fill #0

## 2021-09-04 ENCOUNTER — Other Ambulatory Visit (HOSPITAL_COMMUNITY): Payer: Self-pay

## 2021-09-04 MED ORDER — DICYCLOMINE HCL 20 MG PO TABS
20.0000 mg | ORAL_TABLET | Freq: Three times a day (TID) | ORAL | 0 refills | Status: AC | PRN
  Filled 2021-09-04 – 2022-02-15 (×2): qty 90, 30d supply, fill #0

## 2021-09-14 ENCOUNTER — Other Ambulatory Visit (HOSPITAL_COMMUNITY): Payer: Self-pay

## 2021-10-15 ENCOUNTER — Other Ambulatory Visit (HOSPITAL_COMMUNITY): Payer: Self-pay

## 2021-10-15 MED ORDER — ESCITALOPRAM OXALATE 10 MG PO TABS
10.0000 mg | ORAL_TABLET | Freq: Every day | ORAL | 1 refills | Status: AC
  Filled 2021-10-15: qty 90, 90d supply, fill #0

## 2021-10-15 MED ORDER — FLUOXETINE HCL 10 MG PO CAPS
ORAL_CAPSULE | ORAL | 0 refills | Status: DC
  Filled 2021-10-15: qty 30, 30d supply, fill #0

## 2021-10-15 MED ORDER — TRAZODONE HCL 50 MG PO TABS
ORAL_TABLET | ORAL | 0 refills | Status: DC
  Filled 2021-10-15: qty 15, 30d supply, fill #0

## 2021-10-16 ENCOUNTER — Other Ambulatory Visit (HOSPITAL_COMMUNITY): Payer: Self-pay

## 2021-10-22 ENCOUNTER — Other Ambulatory Visit (HOSPITAL_COMMUNITY): Payer: Self-pay

## 2021-10-23 ENCOUNTER — Other Ambulatory Visit (HOSPITAL_COMMUNITY): Payer: Self-pay

## 2021-11-03 ENCOUNTER — Other Ambulatory Visit (HOSPITAL_COMMUNITY): Payer: Self-pay

## 2021-11-03 MED ORDER — ESOMEPRAZOLE MAGNESIUM 20 MG PO CPDR
20.0000 mg | DELAYED_RELEASE_CAPSULE | Freq: Every day | ORAL | 1 refills | Status: DC
  Filled 2021-11-03: qty 90, 90d supply, fill #0
  Filled 2022-02-01: qty 90, 90d supply, fill #1

## 2021-11-05 ENCOUNTER — Other Ambulatory Visit (HOSPITAL_COMMUNITY): Payer: Self-pay

## 2021-11-05 MED ORDER — NORGESTIMATE-ETH ESTRADIOL 0.25-35 MG-MCG PO TABS
1.0000 | ORAL_TABLET | Freq: Every day | ORAL | 0 refills | Status: DC
  Filled 2021-11-05: qty 28, 28d supply, fill #0

## 2021-11-11 ENCOUNTER — Other Ambulatory Visit (HOSPITAL_COMMUNITY): Payer: Self-pay

## 2021-11-11 MED ORDER — FLUOXETINE HCL 10 MG PO CAPS
ORAL_CAPSULE | ORAL | 0 refills | Status: DC
  Filled 2021-11-11 – 2021-11-12 (×2): qty 30, 30d supply, fill #0

## 2021-11-11 MED ORDER — CLONIDINE HCL ER 0.1 MG PO TB12
ORAL_TABLET | ORAL | 0 refills | Status: DC
  Filled 2021-11-11 – 2021-11-12 (×2): qty 30, 30d supply, fill #0

## 2021-11-11 MED ORDER — TRAZODONE HCL 50 MG PO TABS
ORAL_TABLET | ORAL | 0 refills | Status: AC
Start: 2021-11-11 — End: ?
  Filled 2021-11-11 – 2021-11-12 (×2): qty 15, 30d supply, fill #0

## 2021-11-12 ENCOUNTER — Other Ambulatory Visit (HOSPITAL_COMMUNITY): Payer: Self-pay

## 2021-12-02 ENCOUNTER — Other Ambulatory Visit (HOSPITAL_COMMUNITY): Payer: Self-pay

## 2021-12-02 MED ORDER — NORGESTIMATE-ETH ESTRADIOL 0.25-35 MG-MCG PO TABS
1.0000 | ORAL_TABLET | Freq: Every day | ORAL | 11 refills | Status: DC
  Filled 2021-12-02: qty 28, 28d supply, fill #0
  Filled 2021-12-31: qty 28, 28d supply, fill #1
  Filled 2022-02-01: qty 28, 28d supply, fill #2
  Filled 2022-02-25: qty 28, 28d supply, fill #3
  Filled 2022-03-22: qty 28, 28d supply, fill #4
  Filled 2022-04-25: qty 28, 28d supply, fill #5
  Filled 2022-05-28: qty 28, 28d supply, fill #6
  Filled 2022-06-25: qty 28, 28d supply, fill #7
  Filled 2022-07-21: qty 28, 28d supply, fill #8
  Filled 2022-08-20: qty 28, 28d supply, fill #9
  Filled 2022-09-20: qty 28, 28d supply, fill #10
  Filled 2022-10-21: qty 28, 28d supply, fill #11

## 2021-12-15 ENCOUNTER — Other Ambulatory Visit (HOSPITAL_COMMUNITY): Payer: Self-pay

## 2021-12-15 MED ORDER — CLONIDINE HCL ER 0.1 MG PO TB12
0.1000 mg | ORAL_TABLET | Freq: Every evening | ORAL | 0 refills | Status: DC
  Filled 2021-12-15: qty 30, 30d supply, fill #0

## 2021-12-15 MED ORDER — TOPIRAMATE 25 MG PO TABS
25.0000 mg | ORAL_TABLET | Freq: Every evening | ORAL | 0 refills | Status: DC
  Filled 2021-12-15: qty 30, 30d supply, fill #0

## 2021-12-15 MED ORDER — FLUOXETINE HCL 20 MG PO CAPS
20.0000 mg | ORAL_CAPSULE | Freq: Every morning | ORAL | 0 refills | Status: DC
  Filled 2021-12-15: qty 30, 30d supply, fill #0

## 2021-12-31 ENCOUNTER — Other Ambulatory Visit (HOSPITAL_COMMUNITY): Payer: Self-pay

## 2022-01-15 ENCOUNTER — Other Ambulatory Visit (HOSPITAL_COMMUNITY): Payer: Self-pay

## 2022-01-15 MED ORDER — TOPIRAMATE 25 MG PO TABS
25.0000 mg | ORAL_TABLET | Freq: Every evening | ORAL | 0 refills | Status: AC
  Filled 2022-01-15: qty 30, 30d supply, fill #0

## 2022-01-15 MED ORDER — CLONIDINE HCL ER 0.1 MG PO TB12
0.1000 mg | ORAL_TABLET | Freq: Every evening | ORAL | 0 refills | Status: DC
  Filled 2022-01-15: qty 30, 30d supply, fill #0

## 2022-01-15 MED ORDER — TRAZODONE HCL 50 MG PO TABS
25.0000 mg | ORAL_TABLET | Freq: Every evening | ORAL | 0 refills | Status: DC | PRN
  Filled 2022-01-15: qty 15, 30d supply, fill #0

## 2022-01-15 MED ORDER — FLUOXETINE HCL 20 MG PO CAPS
20.0000 mg | ORAL_CAPSULE | ORAL | 0 refills | Status: DC
  Filled 2022-01-15: qty 30, 30d supply, fill #0

## 2022-02-01 ENCOUNTER — Other Ambulatory Visit (HOSPITAL_COMMUNITY): Payer: Self-pay

## 2022-02-02 ENCOUNTER — Other Ambulatory Visit (HOSPITAL_COMMUNITY): Payer: Self-pay

## 2022-02-02 MED ORDER — VYVANSE 20 MG PO CAPS
20.0000 mg | ORAL_CAPSULE | Freq: Every day | ORAL | 0 refills | Status: AC
  Filled 2022-02-02: qty 30, 30d supply, fill #0

## 2022-02-09 ENCOUNTER — Other Ambulatory Visit (HOSPITAL_COMMUNITY): Payer: Self-pay

## 2022-02-09 MED ORDER — CLONIDINE HCL ER 0.1 MG PO TB12
0.1000 mg | ORAL_TABLET | Freq: Every evening | ORAL | 0 refills | Status: DC
  Filled 2022-02-09: qty 30, 30d supply, fill #0

## 2022-02-09 MED ORDER — FLUOXETINE HCL 40 MG PO CAPS
40.0000 mg | ORAL_CAPSULE | Freq: Every morning | ORAL | 0 refills | Status: DC
  Filled 2022-02-15: qty 30, 30d supply, fill #0

## 2022-02-12 ENCOUNTER — Other Ambulatory Visit (HOSPITAL_COMMUNITY): Payer: Self-pay

## 2022-02-15 ENCOUNTER — Other Ambulatory Visit (HOSPITAL_COMMUNITY): Payer: Self-pay

## 2022-02-25 ENCOUNTER — Other Ambulatory Visit (HOSPITAL_COMMUNITY): Payer: Self-pay

## 2022-03-02 ENCOUNTER — Other Ambulatory Visit (HOSPITAL_COMMUNITY): Payer: Self-pay

## 2022-03-09 ENCOUNTER — Other Ambulatory Visit (HOSPITAL_COMMUNITY): Payer: Self-pay

## 2022-03-09 MED ORDER — FLUOXETINE HCL 40 MG PO CAPS
40.0000 mg | ORAL_CAPSULE | Freq: Every morning | ORAL | 2 refills | Status: DC
  Filled 2022-03-09: qty 30, 30d supply, fill #0
  Filled 2022-04-15: qty 30, 30d supply, fill #1
  Filled 2022-05-19: qty 30, 30d supply, fill #2

## 2022-03-09 MED ORDER — TRAZODONE HCL 50 MG PO TABS
25.0000 mg | ORAL_TABLET | Freq: Every day | ORAL | 2 refills | Status: DC
  Filled 2022-03-09: qty 15, 30d supply, fill #0
  Filled 2022-04-15: qty 15, 30d supply, fill #1
  Filled 2022-05-19: qty 15, 30d supply, fill #2

## 2022-03-09 MED ORDER — CLONIDINE HCL ER 0.1 MG PO TB12
0.1000 mg | ORAL_TABLET | Freq: Every evening | ORAL | 2 refills | Status: AC
Start: 2022-03-09 — End: ?
  Filled 2022-03-09: qty 30, 30d supply, fill #0
  Filled 2022-04-15: qty 30, 30d supply, fill #1

## 2022-03-11 ENCOUNTER — Other Ambulatory Visit (HOSPITAL_COMMUNITY): Payer: Self-pay

## 2022-03-12 ENCOUNTER — Other Ambulatory Visit (HOSPITAL_COMMUNITY): Payer: Self-pay

## 2022-03-12 MED ORDER — VYVANSE 10 MG PO CAPS
10.0000 mg | ORAL_CAPSULE | Freq: Every day | ORAL | 0 refills | Status: AC
  Filled 2022-03-12: qty 30, 30d supply, fill #0

## 2022-03-22 ENCOUNTER — Other Ambulatory Visit (HOSPITAL_COMMUNITY): Payer: Self-pay

## 2022-04-15 ENCOUNTER — Other Ambulatory Visit (HOSPITAL_COMMUNITY): Payer: Self-pay

## 2022-04-16 ENCOUNTER — Other Ambulatory Visit (HOSPITAL_COMMUNITY): Payer: Self-pay

## 2022-04-16 MED ORDER — VYVANSE 30 MG PO CHEW
30.0000 mg | CHEWABLE_TABLET | Freq: Every day | ORAL | 0 refills | Status: DC
  Filled 2022-04-16: qty 30, 30d supply, fill #0

## 2022-04-26 ENCOUNTER — Other Ambulatory Visit (HOSPITAL_COMMUNITY): Payer: Self-pay

## 2022-04-28 ENCOUNTER — Other Ambulatory Visit (HOSPITAL_COMMUNITY): Payer: Self-pay

## 2022-04-28 MED ORDER — LEVOTHYROXINE SODIUM 112 MCG PO TABS
112.0000 ug | ORAL_TABLET | Freq: Every morning | ORAL | 1 refills | Status: DC
  Filled 2022-04-28: qty 90, 90d supply, fill #0
  Filled 2022-07-21: qty 90, 90d supply, fill #1
  Filled ????-??-??: fill #1

## 2022-04-28 MED ORDER — ESOMEPRAZOLE MAGNESIUM 20 MG PO CPDR
20.0000 mg | DELAYED_RELEASE_CAPSULE | Freq: Every day | ORAL | 1 refills | Status: DC
  Filled 2022-04-28: qty 90, 90d supply, fill #0
  Filled 2022-07-21: qty 90, 90d supply, fill #1
  Filled ????-??-??: fill #1

## 2022-05-19 ENCOUNTER — Other Ambulatory Visit (HOSPITAL_COMMUNITY): Payer: Self-pay

## 2022-05-28 ENCOUNTER — Other Ambulatory Visit (HOSPITAL_COMMUNITY): Payer: Self-pay

## 2022-06-16 ENCOUNTER — Other Ambulatory Visit (HOSPITAL_COMMUNITY): Payer: Self-pay

## 2022-06-17 ENCOUNTER — Other Ambulatory Visit (HOSPITAL_COMMUNITY): Payer: Self-pay

## 2022-06-18 ENCOUNTER — Other Ambulatory Visit (HOSPITAL_COMMUNITY): Payer: Self-pay

## 2022-06-18 MED ORDER — VYVANSE 30 MG PO CHEW
30.0000 mg | CHEWABLE_TABLET | Freq: Every day | ORAL | 0 refills | Status: AC
  Filled 2022-06-18: qty 30, 30d supply, fill #0

## 2022-06-22 ENCOUNTER — Other Ambulatory Visit (HOSPITAL_COMMUNITY): Payer: Self-pay

## 2022-06-22 MED ORDER — FLUOXETINE HCL 40 MG PO CAPS
40.0000 mg | ORAL_CAPSULE | Freq: Every morning | ORAL | 5 refills | Status: DC
  Filled 2022-06-22: qty 30, 30d supply, fill #0
  Filled 2022-07-21: qty 30, 30d supply, fill #1
  Filled 2022-08-20: qty 30, 30d supply, fill #2
  Filled 2022-09-20: qty 30, 30d supply, fill #3
  Filled 2022-10-21: qty 30, 30d supply, fill #4
  Filled 2022-11-25: qty 30, 30d supply, fill #5
  Filled ????-??-??: fill #1

## 2022-06-23 ENCOUNTER — Other Ambulatory Visit (HOSPITAL_COMMUNITY): Payer: Self-pay

## 2022-06-23 MED ORDER — TRAZODONE HCL 50 MG PO TABS
25.0000 mg | ORAL_TABLET | Freq: Every day | ORAL | 2 refills | Status: DC
  Filled 2022-06-23: qty 30, 30d supply, fill #0
  Filled 2022-07-21: qty 30, 30d supply, fill #1
  Filled 2022-08-20: qty 30, 30d supply, fill #2
  Filled ????-??-??: fill #1

## 2022-06-25 ENCOUNTER — Other Ambulatory Visit (HOSPITAL_COMMUNITY): Payer: Self-pay

## 2022-07-21 ENCOUNTER — Other Ambulatory Visit (HOSPITAL_COMMUNITY): Payer: Self-pay

## 2022-07-21 MED ORDER — LISDEXAMFETAMINE DIMESYLATE 20 MG PO CAPS
20.0000 mg | ORAL_CAPSULE | Freq: Every day | ORAL | 0 refills | Status: DC
  Filled 2022-07-21: qty 30, 30d supply, fill #0

## 2022-08-06 ENCOUNTER — Other Ambulatory Visit (HOSPITAL_COMMUNITY): Payer: Self-pay

## 2022-08-06 MED ORDER — NITROFURANTOIN MONOHYD MACRO 100 MG PO CAPS
100.0000 mg | ORAL_CAPSULE | Freq: Two times a day (BID) | ORAL | 0 refills | Status: AC
  Filled 2022-08-06 – 2023-02-01 (×2): qty 14, 7d supply, fill #0

## 2022-08-17 ENCOUNTER — Other Ambulatory Visit (HOSPITAL_COMMUNITY): Payer: Self-pay

## 2022-08-20 ENCOUNTER — Other Ambulatory Visit (HOSPITAL_COMMUNITY): Payer: Self-pay

## 2022-08-23 ENCOUNTER — Other Ambulatory Visit (HOSPITAL_COMMUNITY): Payer: Self-pay

## 2022-08-23 MED ORDER — LISDEXAMFETAMINE DIMESYLATE 20 MG PO CAPS
20.0000 mg | ORAL_CAPSULE | Freq: Every day | ORAL | 0 refills | Status: AC
  Filled 2022-08-23: qty 30, 30d supply, fill #0

## 2022-09-20 ENCOUNTER — Other Ambulatory Visit (HOSPITAL_COMMUNITY): Payer: Self-pay

## 2022-09-20 MED ORDER — LISDEXAMFETAMINE DIMESYLATE 30 MG PO CAPS
30.0000 mg | ORAL_CAPSULE | Freq: Every day | ORAL | 0 refills | Status: DC
  Filled 2022-09-20: qty 30, 30d supply, fill #0

## 2022-09-27 ENCOUNTER — Other Ambulatory Visit (HOSPITAL_COMMUNITY): Payer: Self-pay

## 2022-09-27 MED ORDER — TRAZODONE HCL 50 MG PO TABS
25.0000 mg | ORAL_TABLET | Freq: Every evening | ORAL | 3 refills | Status: AC | PRN
  Filled 2022-09-27 – 2022-11-08 (×2): qty 30, 30d supply, fill #0

## 2022-10-08 ENCOUNTER — Other Ambulatory Visit (HOSPITAL_COMMUNITY): Payer: Self-pay

## 2022-10-21 ENCOUNTER — Other Ambulatory Visit: Payer: Self-pay

## 2022-10-21 ENCOUNTER — Other Ambulatory Visit (HOSPITAL_COMMUNITY): Payer: Self-pay

## 2022-10-21 DIAGNOSIS — Z634 Disappearance and death of family member: Secondary | ICD-10-CM | POA: Diagnosis not present

## 2022-10-21 DIAGNOSIS — F331 Major depressive disorder, recurrent, moderate: Secondary | ICD-10-CM | POA: Diagnosis not present

## 2022-10-21 DIAGNOSIS — F902 Attention-deficit hyperactivity disorder, combined type: Secondary | ICD-10-CM | POA: Diagnosis not present

## 2022-10-21 MED ORDER — LEVOTHYROXINE SODIUM 112 MCG PO TABS
112.0000 ug | ORAL_TABLET | Freq: Every morning | ORAL | 1 refills | Status: DC
  Filled 2022-10-21: qty 90, 90d supply, fill #0
  Filled 2023-02-01: qty 90, 90d supply, fill #1

## 2022-10-25 ENCOUNTER — Other Ambulatory Visit (HOSPITAL_COMMUNITY): Payer: Self-pay

## 2022-10-25 MED ORDER — LISDEXAMFETAMINE DIMESYLATE 30 MG PO CAPS
30.0000 mg | ORAL_CAPSULE | Freq: Every day | ORAL | 0 refills | Status: DC
  Filled 2022-10-25: qty 30, 30d supply, fill #0

## 2022-10-28 DIAGNOSIS — Z634 Disappearance and death of family member: Secondary | ICD-10-CM | POA: Diagnosis not present

## 2022-10-28 DIAGNOSIS — F331 Major depressive disorder, recurrent, moderate: Secondary | ICD-10-CM | POA: Diagnosis not present

## 2022-10-28 DIAGNOSIS — F902 Attention-deficit hyperactivity disorder, combined type: Secondary | ICD-10-CM | POA: Diagnosis not present

## 2022-11-01 ENCOUNTER — Other Ambulatory Visit (HOSPITAL_COMMUNITY): Payer: Self-pay

## 2022-11-02 ENCOUNTER — Other Ambulatory Visit (HOSPITAL_COMMUNITY): Payer: Self-pay

## 2022-11-02 MED ORDER — ESOMEPRAZOLE MAGNESIUM 20 MG PO CPDR
20.0000 mg | DELAYED_RELEASE_CAPSULE | Freq: Every day | ORAL | 1 refills | Status: DC
  Filled 2022-11-02: qty 90, 90d supply, fill #0
  Filled 2023-02-10: qty 90, 90d supply, fill #1

## 2022-11-04 DIAGNOSIS — Z634 Disappearance and death of family member: Secondary | ICD-10-CM | POA: Diagnosis not present

## 2022-11-04 DIAGNOSIS — F331 Major depressive disorder, recurrent, moderate: Secondary | ICD-10-CM | POA: Diagnosis not present

## 2022-11-04 DIAGNOSIS — F902 Attention-deficit hyperactivity disorder, combined type: Secondary | ICD-10-CM | POA: Diagnosis not present

## 2022-11-08 ENCOUNTER — Other Ambulatory Visit (HOSPITAL_COMMUNITY): Payer: Self-pay

## 2022-11-11 DIAGNOSIS — F331 Major depressive disorder, recurrent, moderate: Secondary | ICD-10-CM | POA: Diagnosis not present

## 2022-11-11 DIAGNOSIS — Z634 Disappearance and death of family member: Secondary | ICD-10-CM | POA: Diagnosis not present

## 2022-11-11 DIAGNOSIS — F902 Attention-deficit hyperactivity disorder, combined type: Secondary | ICD-10-CM | POA: Diagnosis not present

## 2022-11-18 DIAGNOSIS — F902 Attention-deficit hyperactivity disorder, combined type: Secondary | ICD-10-CM | POA: Diagnosis not present

## 2022-11-18 DIAGNOSIS — F331 Major depressive disorder, recurrent, moderate: Secondary | ICD-10-CM | POA: Diagnosis not present

## 2022-11-18 DIAGNOSIS — Z634 Disappearance and death of family member: Secondary | ICD-10-CM | POA: Diagnosis not present

## 2022-11-19 ENCOUNTER — Other Ambulatory Visit (HOSPITAL_COMMUNITY): Payer: Self-pay

## 2022-11-19 MED ORDER — NORGESTIMATE-ETH ESTRADIOL 0.25-35 MG-MCG PO TABS
1.0000 | ORAL_TABLET | Freq: Every day | ORAL | 1 refills | Status: AC
  Filled 2022-11-19: qty 84, 84d supply, fill #0
  Filled 2023-02-16: qty 84, 84d supply, fill #1

## 2022-11-19 MED ORDER — NORGESTIMATE-ETH ESTRADIOL 0.25-35 MG-MCG PO TABS
1.0000 | ORAL_TABLET | Freq: Every day | ORAL | 1 refills | Status: DC
  Filled 2022-11-19: qty 28, 28d supply, fill #0
  Filled 2023-05-12: qty 56, 56d supply, fill #0

## 2022-11-25 ENCOUNTER — Other Ambulatory Visit: Payer: Self-pay

## 2022-11-25 ENCOUNTER — Other Ambulatory Visit (HOSPITAL_COMMUNITY): Payer: Self-pay

## 2022-11-25 DIAGNOSIS — F331 Major depressive disorder, recurrent, moderate: Secondary | ICD-10-CM | POA: Diagnosis not present

## 2022-11-25 DIAGNOSIS — Z634 Disappearance and death of family member: Secondary | ICD-10-CM | POA: Diagnosis not present

## 2022-11-25 DIAGNOSIS — F902 Attention-deficit hyperactivity disorder, combined type: Secondary | ICD-10-CM | POA: Diagnosis not present

## 2022-11-27 ENCOUNTER — Other Ambulatory Visit (HOSPITAL_COMMUNITY): Payer: Self-pay

## 2022-11-27 MED ORDER — LISDEXAMFETAMINE DIMESYLATE 30 MG PO CAPS
30.0000 mg | ORAL_CAPSULE | Freq: Every day | ORAL | 0 refills | Status: DC
  Filled 2022-11-27: qty 30, 30d supply, fill #0

## 2022-11-29 ENCOUNTER — Other Ambulatory Visit (HOSPITAL_COMMUNITY): Payer: Self-pay

## 2022-11-30 ENCOUNTER — Other Ambulatory Visit (HOSPITAL_COMMUNITY): Payer: Self-pay

## 2022-12-02 DIAGNOSIS — Z634 Disappearance and death of family member: Secondary | ICD-10-CM | POA: Diagnosis not present

## 2022-12-02 DIAGNOSIS — F331 Major depressive disorder, recurrent, moderate: Secondary | ICD-10-CM | POA: Diagnosis not present

## 2022-12-02 DIAGNOSIS — F902 Attention-deficit hyperactivity disorder, combined type: Secondary | ICD-10-CM | POA: Diagnosis not present

## 2022-12-09 DIAGNOSIS — Z634 Disappearance and death of family member: Secondary | ICD-10-CM | POA: Diagnosis not present

## 2022-12-09 DIAGNOSIS — F902 Attention-deficit hyperactivity disorder, combined type: Secondary | ICD-10-CM | POA: Diagnosis not present

## 2022-12-09 DIAGNOSIS — F331 Major depressive disorder, recurrent, moderate: Secondary | ICD-10-CM | POA: Diagnosis not present

## 2022-12-20 ENCOUNTER — Other Ambulatory Visit (HOSPITAL_COMMUNITY): Payer: Self-pay

## 2022-12-21 ENCOUNTER — Other Ambulatory Visit (HOSPITAL_COMMUNITY): Payer: Self-pay

## 2022-12-21 DIAGNOSIS — Z79891 Long term (current) use of opiate analgesic: Secondary | ICD-10-CM | POA: Diagnosis not present

## 2022-12-21 DIAGNOSIS — F419 Anxiety disorder, unspecified: Secondary | ICD-10-CM | POA: Diagnosis not present

## 2022-12-21 DIAGNOSIS — Z79899 Other long term (current) drug therapy: Secondary | ICD-10-CM | POA: Diagnosis not present

## 2022-12-21 DIAGNOSIS — F902 Attention-deficit hyperactivity disorder, combined type: Secondary | ICD-10-CM | POA: Diagnosis not present

## 2022-12-21 DIAGNOSIS — Z5181 Encounter for therapeutic drug level monitoring: Secondary | ICD-10-CM | POA: Diagnosis not present

## 2022-12-21 MED ORDER — FLUOXETINE HCL 40 MG PO CAPS
40.0000 mg | ORAL_CAPSULE | Freq: Every day | ORAL | 1 refills | Status: DC
  Filled 2022-12-21: qty 90, 90d supply, fill #0
  Filled 2023-03-20: qty 90, 90d supply, fill #1

## 2022-12-21 MED ORDER — TRAZODONE HCL 50 MG PO TABS
25.0000 mg | ORAL_TABLET | Freq: Every day | ORAL | 1 refills | Status: DC
  Filled 2022-12-21: qty 90, 90d supply, fill #0
  Filled 2023-04-05: qty 90, 90d supply, fill #1

## 2022-12-21 MED ORDER — LISDEXAMFETAMINE DIMESYLATE 30 MG PO CAPS
30.0000 mg | ORAL_CAPSULE | Freq: Every day | ORAL | 0 refills | Status: DC
  Filled 2022-12-21 – 2023-02-01 (×2): qty 30, 30d supply, fill #0

## 2022-12-23 DIAGNOSIS — F902 Attention-deficit hyperactivity disorder, combined type: Secondary | ICD-10-CM | POA: Diagnosis not present

## 2022-12-23 DIAGNOSIS — F331 Major depressive disorder, recurrent, moderate: Secondary | ICD-10-CM | POA: Diagnosis not present

## 2022-12-23 DIAGNOSIS — Z634 Disappearance and death of family member: Secondary | ICD-10-CM | POA: Diagnosis not present

## 2022-12-24 ENCOUNTER — Other Ambulatory Visit (HOSPITAL_COMMUNITY): Payer: Self-pay

## 2022-12-25 ENCOUNTER — Other Ambulatory Visit (HOSPITAL_COMMUNITY): Payer: Self-pay

## 2022-12-25 MED ORDER — LISDEXAMFETAMINE DIMESYLATE 40 MG PO CAPS
40.0000 mg | ORAL_CAPSULE | Freq: Every day | ORAL | 0 refills | Status: DC
  Filled 2022-12-25: qty 30, 30d supply, fill #0

## 2022-12-27 ENCOUNTER — Other Ambulatory Visit: Payer: Self-pay

## 2022-12-28 ENCOUNTER — Other Ambulatory Visit (HOSPITAL_COMMUNITY): Payer: Self-pay

## 2022-12-31 ENCOUNTER — Other Ambulatory Visit (HOSPITAL_COMMUNITY): Payer: Self-pay

## 2023-01-06 DIAGNOSIS — F902 Attention-deficit hyperactivity disorder, combined type: Secondary | ICD-10-CM | POA: Diagnosis not present

## 2023-01-06 DIAGNOSIS — F331 Major depressive disorder, recurrent, moderate: Secondary | ICD-10-CM | POA: Diagnosis not present

## 2023-01-06 DIAGNOSIS — Z634 Disappearance and death of family member: Secondary | ICD-10-CM | POA: Diagnosis not present

## 2023-01-11 DIAGNOSIS — Z634 Disappearance and death of family member: Secondary | ICD-10-CM | POA: Diagnosis not present

## 2023-01-11 DIAGNOSIS — F331 Major depressive disorder, recurrent, moderate: Secondary | ICD-10-CM | POA: Diagnosis not present

## 2023-01-11 DIAGNOSIS — F902 Attention-deficit hyperactivity disorder, combined type: Secondary | ICD-10-CM | POA: Diagnosis not present

## 2023-01-20 DIAGNOSIS — F331 Major depressive disorder, recurrent, moderate: Secondary | ICD-10-CM | POA: Diagnosis not present

## 2023-01-20 DIAGNOSIS — F902 Attention-deficit hyperactivity disorder, combined type: Secondary | ICD-10-CM | POA: Diagnosis not present

## 2023-01-20 DIAGNOSIS — Z634 Disappearance and death of family member: Secondary | ICD-10-CM | POA: Diagnosis not present

## 2023-01-27 DIAGNOSIS — F331 Major depressive disorder, recurrent, moderate: Secondary | ICD-10-CM | POA: Diagnosis not present

## 2023-01-27 DIAGNOSIS — Z634 Disappearance and death of family member: Secondary | ICD-10-CM | POA: Diagnosis not present

## 2023-01-27 DIAGNOSIS — F902 Attention-deficit hyperactivity disorder, combined type: Secondary | ICD-10-CM | POA: Diagnosis not present

## 2023-02-01 ENCOUNTER — Other Ambulatory Visit (HOSPITAL_COMMUNITY): Payer: Self-pay

## 2023-02-01 ENCOUNTER — Other Ambulatory Visit: Payer: Self-pay

## 2023-02-03 DIAGNOSIS — F902 Attention-deficit hyperactivity disorder, combined type: Secondary | ICD-10-CM | POA: Diagnosis not present

## 2023-02-03 DIAGNOSIS — F331 Major depressive disorder, recurrent, moderate: Secondary | ICD-10-CM | POA: Diagnosis not present

## 2023-02-03 DIAGNOSIS — Z634 Disappearance and death of family member: Secondary | ICD-10-CM | POA: Diagnosis not present

## 2023-02-09 DIAGNOSIS — Z634 Disappearance and death of family member: Secondary | ICD-10-CM | POA: Diagnosis not present

## 2023-02-09 DIAGNOSIS — F331 Major depressive disorder, recurrent, moderate: Secondary | ICD-10-CM | POA: Diagnosis not present

## 2023-02-09 DIAGNOSIS — F902 Attention-deficit hyperactivity disorder, combined type: Secondary | ICD-10-CM | POA: Diagnosis not present

## 2023-02-17 DIAGNOSIS — F331 Major depressive disorder, recurrent, moderate: Secondary | ICD-10-CM | POA: Diagnosis not present

## 2023-02-17 DIAGNOSIS — Z634 Disappearance and death of family member: Secondary | ICD-10-CM | POA: Diagnosis not present

## 2023-02-17 DIAGNOSIS — F902 Attention-deficit hyperactivity disorder, combined type: Secondary | ICD-10-CM | POA: Diagnosis not present

## 2023-02-24 DIAGNOSIS — F902 Attention-deficit hyperactivity disorder, combined type: Secondary | ICD-10-CM | POA: Diagnosis not present

## 2023-02-24 DIAGNOSIS — Z634 Disappearance and death of family member: Secondary | ICD-10-CM | POA: Diagnosis not present

## 2023-02-24 DIAGNOSIS — F331 Major depressive disorder, recurrent, moderate: Secondary | ICD-10-CM | POA: Diagnosis not present

## 2023-02-28 DIAGNOSIS — F902 Attention-deficit hyperactivity disorder, combined type: Secondary | ICD-10-CM | POA: Diagnosis not present

## 2023-02-28 DIAGNOSIS — F331 Major depressive disorder, recurrent, moderate: Secondary | ICD-10-CM | POA: Diagnosis not present

## 2023-02-28 DIAGNOSIS — Z634 Disappearance and death of family member: Secondary | ICD-10-CM | POA: Diagnosis not present

## 2023-03-03 ENCOUNTER — Other Ambulatory Visit (HOSPITAL_COMMUNITY): Payer: Self-pay

## 2023-03-04 ENCOUNTER — Other Ambulatory Visit (HOSPITAL_COMMUNITY): Payer: Self-pay

## 2023-03-04 MED ORDER — LISDEXAMFETAMINE DIMESYLATE 30 MG PO CAPS
30.0000 mg | ORAL_CAPSULE | Freq: Every day | ORAL | 0 refills | Status: DC
  Filled 2023-03-04: qty 30, 30d supply, fill #0

## 2023-03-10 DIAGNOSIS — Z634 Disappearance and death of family member: Secondary | ICD-10-CM | POA: Diagnosis not present

## 2023-03-10 DIAGNOSIS — F902 Attention-deficit hyperactivity disorder, combined type: Secondary | ICD-10-CM | POA: Diagnosis not present

## 2023-03-10 DIAGNOSIS — F331 Major depressive disorder, recurrent, moderate: Secondary | ICD-10-CM | POA: Diagnosis not present

## 2023-03-17 DIAGNOSIS — Z634 Disappearance and death of family member: Secondary | ICD-10-CM | POA: Diagnosis not present

## 2023-03-17 DIAGNOSIS — F331 Major depressive disorder, recurrent, moderate: Secondary | ICD-10-CM | POA: Diagnosis not present

## 2023-03-17 DIAGNOSIS — F902 Attention-deficit hyperactivity disorder, combined type: Secondary | ICD-10-CM | POA: Diagnosis not present

## 2023-03-22 DIAGNOSIS — Z79899 Other long term (current) drug therapy: Secondary | ICD-10-CM | POA: Diagnosis not present

## 2023-03-22 DIAGNOSIS — F902 Attention-deficit hyperactivity disorder, combined type: Secondary | ICD-10-CM | POA: Diagnosis not present

## 2023-03-22 DIAGNOSIS — F419 Anxiety disorder, unspecified: Secondary | ICD-10-CM | POA: Diagnosis not present

## 2023-03-24 DIAGNOSIS — F331 Major depressive disorder, recurrent, moderate: Secondary | ICD-10-CM | POA: Diagnosis not present

## 2023-03-24 DIAGNOSIS — F902 Attention-deficit hyperactivity disorder, combined type: Secondary | ICD-10-CM | POA: Diagnosis not present

## 2023-03-25 ENCOUNTER — Other Ambulatory Visit (HOSPITAL_COMMUNITY): Payer: Self-pay

## 2023-04-05 ENCOUNTER — Other Ambulatory Visit: Payer: Self-pay

## 2023-04-05 ENCOUNTER — Other Ambulatory Visit (HOSPITAL_COMMUNITY): Payer: Self-pay

## 2023-04-07 ENCOUNTER — Other Ambulatory Visit (HOSPITAL_COMMUNITY): Payer: Self-pay

## 2023-04-07 DIAGNOSIS — F331 Major depressive disorder, recurrent, moderate: Secondary | ICD-10-CM | POA: Diagnosis not present

## 2023-04-07 DIAGNOSIS — F902 Attention-deficit hyperactivity disorder, combined type: Secondary | ICD-10-CM | POA: Diagnosis not present

## 2023-04-07 MED ORDER — LISDEXAMFETAMINE DIMESYLATE 30 MG PO CAPS
30.0000 mg | ORAL_CAPSULE | Freq: Every day | ORAL | 0 refills | Status: DC
  Filled 2023-04-07: qty 30, 30d supply, fill #0

## 2023-04-08 ENCOUNTER — Other Ambulatory Visit (HOSPITAL_COMMUNITY): Payer: Self-pay

## 2023-04-14 DIAGNOSIS — F331 Major depressive disorder, recurrent, moderate: Secondary | ICD-10-CM | POA: Diagnosis not present

## 2023-04-14 DIAGNOSIS — F902 Attention-deficit hyperactivity disorder, combined type: Secondary | ICD-10-CM | POA: Diagnosis not present

## 2023-04-21 DIAGNOSIS — F902 Attention-deficit hyperactivity disorder, combined type: Secondary | ICD-10-CM | POA: Diagnosis not present

## 2023-04-21 DIAGNOSIS — F331 Major depressive disorder, recurrent, moderate: Secondary | ICD-10-CM | POA: Diagnosis not present

## 2023-04-28 DIAGNOSIS — F902 Attention-deficit hyperactivity disorder, combined type: Secondary | ICD-10-CM | POA: Diagnosis not present

## 2023-04-28 DIAGNOSIS — F331 Major depressive disorder, recurrent, moderate: Secondary | ICD-10-CM | POA: Diagnosis not present

## 2023-05-06 ENCOUNTER — Other Ambulatory Visit (HOSPITAL_COMMUNITY): Payer: Self-pay

## 2023-05-06 MED ORDER — LEVOTHYROXINE SODIUM 112 MCG PO TABS
112.0000 ug | ORAL_TABLET | Freq: Every morning | ORAL | 1 refills | Status: DC
  Filled 2023-05-06: qty 90, 90d supply, fill #0
  Filled 2023-08-16: qty 90, 90d supply, fill #1

## 2023-05-12 ENCOUNTER — Other Ambulatory Visit (HOSPITAL_COMMUNITY): Payer: Self-pay

## 2023-05-12 ENCOUNTER — Other Ambulatory Visit: Payer: Self-pay

## 2023-05-12 DIAGNOSIS — F331 Major depressive disorder, recurrent, moderate: Secondary | ICD-10-CM | POA: Diagnosis not present

## 2023-05-12 DIAGNOSIS — F902 Attention-deficit hyperactivity disorder, combined type: Secondary | ICD-10-CM | POA: Diagnosis not present

## 2023-05-14 ENCOUNTER — Other Ambulatory Visit (HOSPITAL_COMMUNITY): Payer: Self-pay

## 2023-05-14 MED ORDER — LISDEXAMFETAMINE DIMESYLATE 30 MG PO CAPS
30.0000 mg | ORAL_CAPSULE | Freq: Every day | ORAL | 0 refills | Status: DC
  Filled 2023-05-14: qty 30, 30d supply, fill #0

## 2023-05-16 ENCOUNTER — Other Ambulatory Visit (HOSPITAL_COMMUNITY): Payer: Self-pay

## 2023-05-19 DIAGNOSIS — F902 Attention-deficit hyperactivity disorder, combined type: Secondary | ICD-10-CM | POA: Diagnosis not present

## 2023-05-19 DIAGNOSIS — F331 Major depressive disorder, recurrent, moderate: Secondary | ICD-10-CM | POA: Diagnosis not present

## 2023-05-23 ENCOUNTER — Other Ambulatory Visit (HOSPITAL_COMMUNITY): Payer: Self-pay

## 2023-05-23 MED ORDER — ESOMEPRAZOLE MAGNESIUM 20 MG PO CPDR
20.0000 mg | DELAYED_RELEASE_CAPSULE | Freq: Every day | ORAL | 0 refills | Status: DC
  Filled 2023-05-23: qty 90, 90d supply, fill #0

## 2023-05-26 DIAGNOSIS — F331 Major depressive disorder, recurrent, moderate: Secondary | ICD-10-CM | POA: Diagnosis not present

## 2023-05-26 DIAGNOSIS — F902 Attention-deficit hyperactivity disorder, combined type: Secondary | ICD-10-CM | POA: Diagnosis not present

## 2023-05-30 ENCOUNTER — Other Ambulatory Visit (HOSPITAL_BASED_OUTPATIENT_CLINIC_OR_DEPARTMENT_OTHER): Payer: Self-pay

## 2023-06-02 DIAGNOSIS — F331 Major depressive disorder, recurrent, moderate: Secondary | ICD-10-CM | POA: Diagnosis not present

## 2023-06-02 DIAGNOSIS — F902 Attention-deficit hyperactivity disorder, combined type: Secondary | ICD-10-CM | POA: Diagnosis not present

## 2023-06-03 ENCOUNTER — Other Ambulatory Visit (HOSPITAL_BASED_OUTPATIENT_CLINIC_OR_DEPARTMENT_OTHER): Payer: Self-pay

## 2023-06-09 DIAGNOSIS — F902 Attention-deficit hyperactivity disorder, combined type: Secondary | ICD-10-CM | POA: Diagnosis not present

## 2023-06-09 DIAGNOSIS — F331 Major depressive disorder, recurrent, moderate: Secondary | ICD-10-CM | POA: Diagnosis not present

## 2023-06-15 ENCOUNTER — Other Ambulatory Visit (HOSPITAL_COMMUNITY): Payer: Self-pay

## 2023-06-17 ENCOUNTER — Other Ambulatory Visit (HOSPITAL_COMMUNITY): Payer: Self-pay

## 2023-06-18 ENCOUNTER — Other Ambulatory Visit (HOSPITAL_COMMUNITY): Payer: Self-pay

## 2023-06-18 MED ORDER — LISDEXAMFETAMINE DIMESYLATE 30 MG PO CAPS
30.0000 mg | ORAL_CAPSULE | Freq: Every day | ORAL | 0 refills | Status: AC
Start: 2023-06-17 — End: ?
  Filled 2023-06-18: qty 30, 30d supply, fill #0

## 2023-06-18 MED ORDER — LISDEXAMFETAMINE DIMESYLATE 40 MG PO CAPS: 40.0000 mg | ORAL_CAPSULE | Freq: Every day | ORAL | 0 refills | Status: AC

## 2023-06-21 ENCOUNTER — Other Ambulatory Visit (HOSPITAL_COMMUNITY): Payer: Self-pay

## 2023-06-29 ENCOUNTER — Other Ambulatory Visit (HOSPITAL_COMMUNITY): Payer: Self-pay

## 2023-06-30 ENCOUNTER — Other Ambulatory Visit (HOSPITAL_COMMUNITY): Payer: Self-pay

## 2023-06-30 DIAGNOSIS — F902 Attention-deficit hyperactivity disorder, combined type: Secondary | ICD-10-CM | POA: Diagnosis not present

## 2023-06-30 DIAGNOSIS — F419 Anxiety disorder, unspecified: Secondary | ICD-10-CM | POA: Diagnosis not present

## 2023-06-30 DIAGNOSIS — F331 Major depressive disorder, recurrent, moderate: Secondary | ICD-10-CM | POA: Diagnosis not present

## 2023-06-30 DIAGNOSIS — Z79899 Other long term (current) drug therapy: Secondary | ICD-10-CM | POA: Diagnosis not present

## 2023-06-30 MED ORDER — FLUOXETINE HCL 40 MG PO CAPS
40.0000 mg | ORAL_CAPSULE | Freq: Every day | ORAL | 3 refills | Status: DC
  Filled 2023-06-30: qty 90, 90d supply, fill #0
  Filled 2023-09-26 – 2023-10-03 (×3): qty 90, 90d supply, fill #1
  Filled 2023-12-26: qty 90, 90d supply, fill #2
  Filled 2024-03-28: qty 90, 90d supply, fill #3

## 2023-07-07 DIAGNOSIS — F902 Attention-deficit hyperactivity disorder, combined type: Secondary | ICD-10-CM | POA: Diagnosis not present

## 2023-07-07 DIAGNOSIS — F331 Major depressive disorder, recurrent, moderate: Secondary | ICD-10-CM | POA: Diagnosis not present

## 2023-07-14 ENCOUNTER — Other Ambulatory Visit (HOSPITAL_COMMUNITY): Payer: Self-pay

## 2023-07-14 DIAGNOSIS — F902 Attention-deficit hyperactivity disorder, combined type: Secondary | ICD-10-CM | POA: Diagnosis not present

## 2023-07-14 DIAGNOSIS — F331 Major depressive disorder, recurrent, moderate: Secondary | ICD-10-CM | POA: Diagnosis not present

## 2023-07-15 ENCOUNTER — Encounter (HOSPITAL_COMMUNITY): Payer: Self-pay

## 2023-07-15 ENCOUNTER — Other Ambulatory Visit (HOSPITAL_COMMUNITY): Payer: Self-pay

## 2023-07-18 ENCOUNTER — Other Ambulatory Visit (HOSPITAL_COMMUNITY): Payer: Self-pay

## 2023-07-18 MED ORDER — NORGESTIMATE-ETH ESTRADIOL 0.25-35 MG-MCG PO TABS
1.0000 | ORAL_TABLET | Freq: Every day | ORAL | 0 refills | Status: DC
  Filled 2023-07-18: qty 84, 84d supply, fill #0

## 2023-07-19 ENCOUNTER — Other Ambulatory Visit (HOSPITAL_COMMUNITY): Payer: Self-pay

## 2023-07-19 MED ORDER — LISDEXAMFETAMINE DIMESYLATE 40 MG PO CAPS
40.0000 mg | ORAL_CAPSULE | Freq: Every day | ORAL | 0 refills | Status: AC
Start: 2023-07-19 — End: ?
  Filled 2023-07-19: qty 30, 30d supply, fill #0

## 2023-07-20 DIAGNOSIS — H5213 Myopia, bilateral: Secondary | ICD-10-CM | POA: Diagnosis not present

## 2023-07-28 ENCOUNTER — Other Ambulatory Visit (HOSPITAL_COMMUNITY): Payer: Self-pay

## 2023-07-28 DIAGNOSIS — F902 Attention-deficit hyperactivity disorder, combined type: Secondary | ICD-10-CM | POA: Diagnosis not present

## 2023-07-28 DIAGNOSIS — F331 Major depressive disorder, recurrent, moderate: Secondary | ICD-10-CM | POA: Diagnosis not present

## 2023-07-29 ENCOUNTER — Other Ambulatory Visit (HOSPITAL_COMMUNITY): Payer: Self-pay

## 2023-08-04 DIAGNOSIS — F902 Attention-deficit hyperactivity disorder, combined type: Secondary | ICD-10-CM | POA: Diagnosis not present

## 2023-08-04 DIAGNOSIS — F331 Major depressive disorder, recurrent, moderate: Secondary | ICD-10-CM | POA: Diagnosis not present

## 2023-08-16 ENCOUNTER — Other Ambulatory Visit (HOSPITAL_COMMUNITY): Payer: Self-pay

## 2023-08-16 ENCOUNTER — Other Ambulatory Visit: Payer: Self-pay

## 2023-08-16 MED ORDER — ESOMEPRAZOLE MAGNESIUM 20 MG PO CPDR
20.0000 mg | DELAYED_RELEASE_CAPSULE | Freq: Every day | ORAL | 1 refills | Status: DC
Start: 2023-08-16 — End: 2024-02-21
  Filled 2023-08-16: qty 90, 90d supply, fill #0
  Filled 2023-11-22: qty 90, 90d supply, fill #1

## 2023-08-17 ENCOUNTER — Other Ambulatory Visit (HOSPITAL_COMMUNITY): Payer: Self-pay

## 2023-08-18 DIAGNOSIS — E282 Polycystic ovarian syndrome: Secondary | ICD-10-CM | POA: Diagnosis not present

## 2023-08-18 DIAGNOSIS — K219 Gastro-esophageal reflux disease without esophagitis: Secondary | ICD-10-CM | POA: Diagnosis not present

## 2023-08-18 DIAGNOSIS — E782 Mixed hyperlipidemia: Secondary | ICD-10-CM | POA: Diagnosis not present

## 2023-08-18 DIAGNOSIS — K8020 Calculus of gallbladder without cholecystitis without obstruction: Secondary | ICD-10-CM | POA: Diagnosis not present

## 2023-08-18 DIAGNOSIS — Z0000 Encounter for general adult medical examination without abnormal findings: Secondary | ICD-10-CM | POA: Diagnosis not present

## 2023-08-18 DIAGNOSIS — E039 Hypothyroidism, unspecified: Secondary | ICD-10-CM | POA: Diagnosis not present

## 2023-08-18 DIAGNOSIS — K589 Irritable bowel syndrome without diarrhea: Secondary | ICD-10-CM | POA: Diagnosis not present

## 2023-08-18 DIAGNOSIS — Z800 Family history of malignant neoplasm of digestive organs: Secondary | ICD-10-CM | POA: Diagnosis not present

## 2023-08-18 DIAGNOSIS — E559 Vitamin D deficiency, unspecified: Secondary | ICD-10-CM | POA: Diagnosis not present

## 2023-08-18 DIAGNOSIS — K760 Fatty (change of) liver, not elsewhere classified: Secondary | ICD-10-CM | POA: Diagnosis not present

## 2023-08-18 DIAGNOSIS — F3341 Major depressive disorder, recurrent, in partial remission: Secondary | ICD-10-CM | POA: Diagnosis not present

## 2023-08-24 ENCOUNTER — Other Ambulatory Visit (HOSPITAL_COMMUNITY): Payer: Self-pay

## 2023-08-25 ENCOUNTER — Other Ambulatory Visit (HOSPITAL_COMMUNITY): Payer: Self-pay

## 2023-08-25 DIAGNOSIS — F331 Major depressive disorder, recurrent, moderate: Secondary | ICD-10-CM | POA: Diagnosis not present

## 2023-08-25 DIAGNOSIS — F902 Attention-deficit hyperactivity disorder, combined type: Secondary | ICD-10-CM | POA: Diagnosis not present

## 2023-08-25 MED ORDER — LISDEXAMFETAMINE DIMESYLATE 40 MG PO CAPS
40.0000 mg | ORAL_CAPSULE | Freq: Every day | ORAL | 0 refills | Status: DC
  Filled 2023-08-25: qty 30, 30d supply, fill #0

## 2023-08-26 ENCOUNTER — Encounter (HOSPITAL_COMMUNITY): Payer: Self-pay

## 2023-08-26 ENCOUNTER — Other Ambulatory Visit (HOSPITAL_COMMUNITY): Payer: Self-pay

## 2023-09-08 DIAGNOSIS — F331 Major depressive disorder, recurrent, moderate: Secondary | ICD-10-CM | POA: Diagnosis not present

## 2023-09-08 DIAGNOSIS — F902 Attention-deficit hyperactivity disorder, combined type: Secondary | ICD-10-CM | POA: Diagnosis not present

## 2023-09-22 DIAGNOSIS — F902 Attention-deficit hyperactivity disorder, combined type: Secondary | ICD-10-CM | POA: Diagnosis not present

## 2023-09-22 DIAGNOSIS — F331 Major depressive disorder, recurrent, moderate: Secondary | ICD-10-CM | POA: Diagnosis not present

## 2023-09-26 ENCOUNTER — Other Ambulatory Visit: Payer: Self-pay

## 2023-09-26 ENCOUNTER — Other Ambulatory Visit (HOSPITAL_COMMUNITY): Payer: Self-pay

## 2023-09-26 MED ORDER — LISDEXAMFETAMINE DIMESYLATE 40 MG PO CAPS
40.0000 mg | ORAL_CAPSULE | Freq: Every day | ORAL | 0 refills | Status: DC
  Filled 2023-09-26: qty 30, 30d supply, fill #0

## 2023-09-27 ENCOUNTER — Other Ambulatory Visit (HOSPITAL_COMMUNITY): Payer: Self-pay

## 2023-09-27 MED ORDER — TRAZODONE HCL 50 MG PO TABS
50.0000 mg | ORAL_TABLET | Freq: Every day | ORAL | 0 refills | Status: DC
  Filled 2023-09-27 – 2023-10-03 (×2): qty 90, 90d supply, fill #0

## 2023-09-29 ENCOUNTER — Other Ambulatory Visit: Payer: Self-pay

## 2023-09-29 DIAGNOSIS — F902 Attention-deficit hyperactivity disorder, combined type: Secondary | ICD-10-CM | POA: Diagnosis not present

## 2023-09-29 DIAGNOSIS — F331 Major depressive disorder, recurrent, moderate: Secondary | ICD-10-CM | POA: Diagnosis not present

## 2023-09-30 DIAGNOSIS — F902 Attention-deficit hyperactivity disorder, combined type: Secondary | ICD-10-CM | POA: Diagnosis not present

## 2023-09-30 DIAGNOSIS — F419 Anxiety disorder, unspecified: Secondary | ICD-10-CM | POA: Diagnosis not present

## 2023-09-30 DIAGNOSIS — Z79899 Other long term (current) drug therapy: Secondary | ICD-10-CM | POA: Diagnosis not present

## 2023-10-03 ENCOUNTER — Other Ambulatory Visit: Payer: Self-pay

## 2023-10-03 ENCOUNTER — Other Ambulatory Visit (HOSPITAL_COMMUNITY): Payer: Self-pay

## 2023-10-04 ENCOUNTER — Other Ambulatory Visit (HOSPITAL_COMMUNITY): Payer: Self-pay

## 2023-10-04 MED ORDER — NORGESTIMATE-ETH ESTRADIOL 0.25-35 MG-MCG PO TABS
1.0000 | ORAL_TABLET | Freq: Every day | ORAL | 3 refills | Status: DC
  Filled 2023-10-04: qty 84, 84d supply, fill #0
  Filled 2023-12-27: qty 84, 84d supply, fill #1
  Filled 2024-03-21: qty 84, 84d supply, fill #2
  Filled 2024-06-20: qty 84, 84d supply, fill #3

## 2023-10-05 DIAGNOSIS — H5310 Unspecified subjective visual disturbances: Secondary | ICD-10-CM | POA: Diagnosis not present

## 2023-10-05 DIAGNOSIS — H2000 Unspecified acute and subacute iridocyclitis: Secondary | ICD-10-CM | POA: Diagnosis not present

## 2023-10-06 ENCOUNTER — Other Ambulatory Visit (HOSPITAL_COMMUNITY): Payer: Self-pay

## 2023-10-06 DIAGNOSIS — F331 Major depressive disorder, recurrent, moderate: Secondary | ICD-10-CM | POA: Diagnosis not present

## 2023-10-06 DIAGNOSIS — F902 Attention-deficit hyperactivity disorder, combined type: Secondary | ICD-10-CM | POA: Diagnosis not present

## 2023-10-06 MED ORDER — VALACYCLOVIR HCL 1 G PO TABS
ORAL_TABLET | ORAL | 3 refills | Status: AC
  Filled 2023-10-06: qty 30, 16d supply, fill #0
  Filled 2023-10-06: qty 42, 28d supply, fill #0

## 2023-10-06 MED ORDER — PREDNISOLONE ACETATE 1 % OP SUSP
OPHTHALMIC | 0 refills | Status: AC
  Filled 2023-10-06: qty 5, 14d supply, fill #0

## 2023-10-20 DIAGNOSIS — F902 Attention-deficit hyperactivity disorder, combined type: Secondary | ICD-10-CM | POA: Diagnosis not present

## 2023-10-20 DIAGNOSIS — F331 Major depressive disorder, recurrent, moderate: Secondary | ICD-10-CM | POA: Diagnosis not present

## 2023-10-27 ENCOUNTER — Other Ambulatory Visit (HOSPITAL_COMMUNITY): Payer: Self-pay

## 2023-10-27 DIAGNOSIS — H04123 Dry eye syndrome of bilateral lacrimal glands: Secondary | ICD-10-CM | POA: Diagnosis not present

## 2023-10-27 MED ORDER — LISDEXAMFETAMINE DIMESYLATE 40 MG PO CAPS
40.0000 mg | ORAL_CAPSULE | Freq: Every day | ORAL | 0 refills | Status: AC
  Filled 2023-10-27: qty 30, 30d supply, fill #0

## 2023-10-28 ENCOUNTER — Other Ambulatory Visit (HOSPITAL_COMMUNITY): Payer: Self-pay

## 2023-11-03 DIAGNOSIS — F902 Attention-deficit hyperactivity disorder, combined type: Secondary | ICD-10-CM | POA: Diagnosis not present

## 2023-11-03 DIAGNOSIS — F331 Major depressive disorder, recurrent, moderate: Secondary | ICD-10-CM | POA: Diagnosis not present

## 2023-11-10 DIAGNOSIS — F331 Major depressive disorder, recurrent, moderate: Secondary | ICD-10-CM | POA: Diagnosis not present

## 2023-11-10 DIAGNOSIS — F902 Attention-deficit hyperactivity disorder, combined type: Secondary | ICD-10-CM | POA: Diagnosis not present

## 2023-11-14 ENCOUNTER — Other Ambulatory Visit (HOSPITAL_COMMUNITY): Payer: Self-pay

## 2023-11-14 MED ORDER — LEVOTHYROXINE SODIUM 112 MCG PO TABS
112.0000 ug | ORAL_TABLET | ORAL | 3 refills | Status: DC
  Filled 2023-11-14: qty 90, 90d supply, fill #0
  Filled 2024-02-21: qty 90, 90d supply, fill #1
  Filled 2024-05-28: qty 90, 90d supply, fill #2
  Filled 2024-08-27: qty 90, 90d supply, fill #3

## 2023-11-24 ENCOUNTER — Other Ambulatory Visit (HOSPITAL_COMMUNITY): Payer: Self-pay

## 2023-11-24 DIAGNOSIS — F902 Attention-deficit hyperactivity disorder, combined type: Secondary | ICD-10-CM | POA: Diagnosis not present

## 2023-11-24 DIAGNOSIS — F331 Major depressive disorder, recurrent, moderate: Secondary | ICD-10-CM | POA: Diagnosis not present

## 2023-11-28 ENCOUNTER — Other Ambulatory Visit (HOSPITAL_COMMUNITY): Payer: Self-pay

## 2023-11-28 MED ORDER — LISDEXAMFETAMINE DIMESYLATE 40 MG PO CAPS
40.0000 mg | ORAL_CAPSULE | Freq: Every day | ORAL | 0 refills | Status: AC
  Filled 2023-11-28: qty 30, 30d supply, fill #0

## 2023-12-01 DIAGNOSIS — F331 Major depressive disorder, recurrent, moderate: Secondary | ICD-10-CM | POA: Diagnosis not present

## 2023-12-01 DIAGNOSIS — F902 Attention-deficit hyperactivity disorder, combined type: Secondary | ICD-10-CM | POA: Diagnosis not present

## 2023-12-08 DIAGNOSIS — F331 Major depressive disorder, recurrent, moderate: Secondary | ICD-10-CM | POA: Diagnosis not present

## 2023-12-08 DIAGNOSIS — F902 Attention-deficit hyperactivity disorder, combined type: Secondary | ICD-10-CM | POA: Diagnosis not present

## 2023-12-15 DIAGNOSIS — F331 Major depressive disorder, recurrent, moderate: Secondary | ICD-10-CM | POA: Diagnosis not present

## 2023-12-15 DIAGNOSIS — F902 Attention-deficit hyperactivity disorder, combined type: Secondary | ICD-10-CM | POA: Diagnosis not present

## 2023-12-22 DIAGNOSIS — D2262 Melanocytic nevi of left upper limb, including shoulder: Secondary | ICD-10-CM | POA: Diagnosis not present

## 2023-12-22 DIAGNOSIS — D1801 Hemangioma of skin and subcutaneous tissue: Secondary | ICD-10-CM | POA: Diagnosis not present

## 2023-12-22 DIAGNOSIS — D2271 Melanocytic nevi of right lower limb, including hip: Secondary | ICD-10-CM | POA: Diagnosis not present

## 2023-12-22 DIAGNOSIS — F331 Major depressive disorder, recurrent, moderate: Secondary | ICD-10-CM | POA: Diagnosis not present

## 2023-12-22 DIAGNOSIS — C44319 Basal cell carcinoma of skin of other parts of face: Secondary | ICD-10-CM | POA: Diagnosis not present

## 2023-12-22 DIAGNOSIS — D2372 Other benign neoplasm of skin of left lower limb, including hip: Secondary | ICD-10-CM | POA: Diagnosis not present

## 2023-12-22 DIAGNOSIS — F902 Attention-deficit hyperactivity disorder, combined type: Secondary | ICD-10-CM | POA: Diagnosis not present

## 2023-12-22 DIAGNOSIS — L821 Other seborrheic keratosis: Secondary | ICD-10-CM | POA: Diagnosis not present

## 2023-12-22 DIAGNOSIS — D224 Melanocytic nevi of scalp and neck: Secondary | ICD-10-CM | POA: Diagnosis not present

## 2023-12-22 DIAGNOSIS — L710 Perioral dermatitis: Secondary | ICD-10-CM | POA: Diagnosis not present

## 2023-12-22 DIAGNOSIS — D2261 Melanocytic nevi of right upper limb, including shoulder: Secondary | ICD-10-CM | POA: Diagnosis not present

## 2023-12-22 DIAGNOSIS — D225 Melanocytic nevi of trunk: Secondary | ICD-10-CM | POA: Diagnosis not present

## 2023-12-26 ENCOUNTER — Other Ambulatory Visit (HOSPITAL_COMMUNITY): Payer: Self-pay

## 2023-12-28 ENCOUNTER — Other Ambulatory Visit (HOSPITAL_COMMUNITY): Payer: Self-pay

## 2023-12-28 MED ORDER — METRONIDAZOLE 0.75 % EX CREA
1.0000 | TOPICAL_CREAM | Freq: Two times a day (BID) | CUTANEOUS | 0 refills | Status: AC
  Filled 2023-12-28: qty 45, 23d supply, fill #0

## 2023-12-29 ENCOUNTER — Other Ambulatory Visit (HOSPITAL_COMMUNITY): Payer: Self-pay

## 2023-12-29 DIAGNOSIS — F902 Attention-deficit hyperactivity disorder, combined type: Secondary | ICD-10-CM | POA: Diagnosis not present

## 2023-12-29 DIAGNOSIS — F331 Major depressive disorder, recurrent, moderate: Secondary | ICD-10-CM | POA: Diagnosis not present

## 2023-12-29 DIAGNOSIS — Z79899 Other long term (current) drug therapy: Secondary | ICD-10-CM | POA: Diagnosis not present

## 2023-12-29 DIAGNOSIS — F419 Anxiety disorder, unspecified: Secondary | ICD-10-CM | POA: Diagnosis not present

## 2023-12-29 MED ORDER — LISDEXAMFETAMINE DIMESYLATE 30 MG PO CAPS
30.0000 mg | ORAL_CAPSULE | Freq: Every day | ORAL | 0 refills | Status: DC
  Filled 2023-12-29: qty 30, 30d supply, fill #0

## 2023-12-29 MED ORDER — LISDEXAMFETAMINE DIMESYLATE 20 MG PO CAPS
20.0000 mg | ORAL_CAPSULE | Freq: Every day | ORAL | 0 refills | Status: DC
  Filled 2023-12-29: qty 30, 30d supply, fill #0

## 2024-01-13 DIAGNOSIS — R109 Unspecified abdominal pain: Secondary | ICD-10-CM | POA: Diagnosis not present

## 2024-01-13 DIAGNOSIS — K219 Gastro-esophageal reflux disease without esophagitis: Secondary | ICD-10-CM | POA: Diagnosis not present

## 2024-01-13 DIAGNOSIS — K639 Disease of intestine, unspecified: Secondary | ICD-10-CM | POA: Diagnosis not present

## 2024-01-19 DIAGNOSIS — F902 Attention-deficit hyperactivity disorder, combined type: Secondary | ICD-10-CM | POA: Diagnosis not present

## 2024-01-19 DIAGNOSIS — F331 Major depressive disorder, recurrent, moderate: Secondary | ICD-10-CM | POA: Diagnosis not present

## 2024-01-26 DIAGNOSIS — F331 Major depressive disorder, recurrent, moderate: Secondary | ICD-10-CM | POA: Diagnosis not present

## 2024-01-26 DIAGNOSIS — F902 Attention-deficit hyperactivity disorder, combined type: Secondary | ICD-10-CM | POA: Diagnosis not present

## 2024-01-28 ENCOUNTER — Other Ambulatory Visit (HOSPITAL_BASED_OUTPATIENT_CLINIC_OR_DEPARTMENT_OTHER): Payer: Self-pay

## 2024-01-28 MED ORDER — LISDEXAMFETAMINE DIMESYLATE 20 MG PO CAPS
20.0000 mg | ORAL_CAPSULE | Freq: Every day | ORAL | 0 refills | Status: DC
Start: 2024-01-27 — End: 2024-02-29
  Filled 2024-01-28: qty 30, 30d supply, fill #0

## 2024-01-28 MED ORDER — LISDEXAMFETAMINE DIMESYLATE 30 MG PO CAPS
30.0000 mg | ORAL_CAPSULE | Freq: Every day | ORAL | 0 refills | Status: DC
  Filled 2024-01-28: qty 30, 30d supply, fill #0

## 2024-02-07 ENCOUNTER — Other Ambulatory Visit (HOSPITAL_BASED_OUTPATIENT_CLINIC_OR_DEPARTMENT_OTHER): Payer: Self-pay

## 2024-02-07 DIAGNOSIS — Z85828 Personal history of other malignant neoplasm of skin: Secondary | ICD-10-CM | POA: Diagnosis not present

## 2024-02-07 DIAGNOSIS — C44319 Basal cell carcinoma of skin of other parts of face: Secondary | ICD-10-CM | POA: Diagnosis not present

## 2024-02-07 MED ORDER — DOXYCYCLINE HYCLATE 100 MG PO CAPS
100.0000 mg | ORAL_CAPSULE | Freq: Two times a day (BID) | ORAL | 0 refills | Status: AC
  Filled 2024-02-07: qty 10, 5d supply, fill #0

## 2024-02-08 ENCOUNTER — Other Ambulatory Visit (HOSPITAL_COMMUNITY): Payer: Self-pay

## 2024-02-08 MED ORDER — BISACODYL 5 MG PO TBEC
DELAYED_RELEASE_TABLET | ORAL | 0 refills | Status: AC
  Filled 2024-02-08: qty 4, 1d supply, fill #0

## 2024-02-08 MED ORDER — PEG 3350-KCL-NA BICARB-NACL 420 G PO SOLR
4000.0000 mL | ORAL | 0 refills | Status: AC
Start: 2024-01-13 — End: ?
  Filled 2024-02-08: qty 4000, 2d supply, fill #0

## 2024-02-09 DIAGNOSIS — F902 Attention-deficit hyperactivity disorder, combined type: Secondary | ICD-10-CM | POA: Diagnosis not present

## 2024-02-09 DIAGNOSIS — F331 Major depressive disorder, recurrent, moderate: Secondary | ICD-10-CM | POA: Diagnosis not present

## 2024-02-10 ENCOUNTER — Other Ambulatory Visit (HOSPITAL_COMMUNITY): Payer: Self-pay

## 2024-02-14 DIAGNOSIS — K2930 Chronic superficial gastritis without bleeding: Secondary | ICD-10-CM | POA: Diagnosis not present

## 2024-02-14 DIAGNOSIS — K921 Melena: Secondary | ICD-10-CM | POA: Diagnosis not present

## 2024-02-14 DIAGNOSIS — R1013 Epigastric pain: Secondary | ICD-10-CM | POA: Diagnosis not present

## 2024-02-14 DIAGNOSIS — R1084 Generalized abdominal pain: Secondary | ICD-10-CM | POA: Diagnosis not present

## 2024-02-14 DIAGNOSIS — K2970 Gastritis, unspecified, without bleeding: Secondary | ICD-10-CM | POA: Diagnosis not present

## 2024-02-14 DIAGNOSIS — R197 Diarrhea, unspecified: Secondary | ICD-10-CM | POA: Diagnosis not present

## 2024-02-16 DIAGNOSIS — F331 Major depressive disorder, recurrent, moderate: Secondary | ICD-10-CM | POA: Diagnosis not present

## 2024-02-16 DIAGNOSIS — F902 Attention-deficit hyperactivity disorder, combined type: Secondary | ICD-10-CM | POA: Diagnosis not present

## 2024-02-21 ENCOUNTER — Other Ambulatory Visit (HOSPITAL_COMMUNITY): Payer: Self-pay

## 2024-02-21 ENCOUNTER — Other Ambulatory Visit: Payer: Self-pay

## 2024-02-21 MED ORDER — ESOMEPRAZOLE MAGNESIUM 20 MG PO CPDR
20.0000 mg | DELAYED_RELEASE_CAPSULE | Freq: Every day | ORAL | 1 refills | Status: DC
Start: 2024-02-21 — End: 2024-08-27
  Filled 2024-02-21: qty 90, 90d supply, fill #0
  Filled 2024-05-28: qty 90, 90d supply, fill #1

## 2024-02-23 DIAGNOSIS — F331 Major depressive disorder, recurrent, moderate: Secondary | ICD-10-CM | POA: Diagnosis not present

## 2024-02-23 DIAGNOSIS — F902 Attention-deficit hyperactivity disorder, combined type: Secondary | ICD-10-CM | POA: Diagnosis not present

## 2024-02-29 ENCOUNTER — Other Ambulatory Visit (HOSPITAL_COMMUNITY): Payer: Self-pay

## 2024-02-29 MED ORDER — LISDEXAMFETAMINE DIMESYLATE 20 MG PO CAPS
20.0000 mg | ORAL_CAPSULE | Freq: Every day | ORAL | 0 refills | Status: AC
  Filled 2024-02-29 – 2024-03-21 (×2): qty 30, 30d supply, fill #0

## 2024-02-29 MED ORDER — LISDEXAMFETAMINE DIMESYLATE 30 MG PO CAPS
30.0000 mg | ORAL_CAPSULE | Freq: Every day | ORAL | 0 refills | Status: AC
  Filled 2024-02-29 – 2024-03-21 (×2): qty 30, 30d supply, fill #0

## 2024-03-01 DIAGNOSIS — F331 Major depressive disorder, recurrent, moderate: Secondary | ICD-10-CM | POA: Diagnosis not present

## 2024-03-01 DIAGNOSIS — F902 Attention-deficit hyperactivity disorder, combined type: Secondary | ICD-10-CM | POA: Diagnosis not present

## 2024-03-08 DIAGNOSIS — F331 Major depressive disorder, recurrent, moderate: Secondary | ICD-10-CM | POA: Diagnosis not present

## 2024-03-08 DIAGNOSIS — F902 Attention-deficit hyperactivity disorder, combined type: Secondary | ICD-10-CM | POA: Diagnosis not present

## 2024-03-09 ENCOUNTER — Other Ambulatory Visit (HOSPITAL_COMMUNITY): Payer: Self-pay

## 2024-03-15 DIAGNOSIS — F902 Attention-deficit hyperactivity disorder, combined type: Secondary | ICD-10-CM | POA: Diagnosis not present

## 2024-03-15 DIAGNOSIS — F331 Major depressive disorder, recurrent, moderate: Secondary | ICD-10-CM | POA: Diagnosis not present

## 2024-03-21 ENCOUNTER — Other Ambulatory Visit (HOSPITAL_COMMUNITY): Payer: Self-pay

## 2024-03-26 ENCOUNTER — Other Ambulatory Visit (HOSPITAL_COMMUNITY): Payer: Self-pay

## 2024-03-26 DIAGNOSIS — Z79899 Other long term (current) drug therapy: Secondary | ICD-10-CM | POA: Diagnosis not present

## 2024-03-26 DIAGNOSIS — F902 Attention-deficit hyperactivity disorder, combined type: Secondary | ICD-10-CM | POA: Diagnosis not present

## 2024-03-26 MED ORDER — LISDEXAMFETAMINE DIMESYLATE 20 MG PO CAPS
20.0000 mg | ORAL_CAPSULE | Freq: Every day | ORAL | 0 refills | Status: AC
  Filled 2024-03-26 – 2024-04-25 (×3): qty 30, 30d supply, fill #0

## 2024-03-26 MED ORDER — LISDEXAMFETAMINE DIMESYLATE 40 MG PO CAPS
40.0000 mg | ORAL_CAPSULE | Freq: Every day | ORAL | 0 refills | Status: DC
  Filled 2024-03-26: qty 30, 30d supply, fill #0

## 2024-03-27 ENCOUNTER — Other Ambulatory Visit: Payer: Self-pay

## 2024-03-28 ENCOUNTER — Other Ambulatory Visit (HOSPITAL_COMMUNITY): Payer: Self-pay

## 2024-03-30 ENCOUNTER — Other Ambulatory Visit (HOSPITAL_COMMUNITY): Payer: Self-pay

## 2024-04-12 DIAGNOSIS — F331 Major depressive disorder, recurrent, moderate: Secondary | ICD-10-CM | POA: Diagnosis not present

## 2024-04-12 DIAGNOSIS — F902 Attention-deficit hyperactivity disorder, combined type: Secondary | ICD-10-CM | POA: Diagnosis not present

## 2024-04-19 DIAGNOSIS — F902 Attention-deficit hyperactivity disorder, combined type: Secondary | ICD-10-CM | POA: Diagnosis not present

## 2024-04-19 DIAGNOSIS — F331 Major depressive disorder, recurrent, moderate: Secondary | ICD-10-CM | POA: Diagnosis not present

## 2024-04-24 ENCOUNTER — Other Ambulatory Visit (HOSPITAL_COMMUNITY): Payer: Self-pay

## 2024-04-24 ENCOUNTER — Other Ambulatory Visit: Payer: Self-pay

## 2024-04-24 MED ORDER — LISDEXAMFETAMINE DIMESYLATE 20 MG PO CAPS
20.0000 mg | ORAL_CAPSULE | Freq: Every day | ORAL | 0 refills | Status: DC
  Filled 2024-04-24: qty 30, 30d supply, fill #0

## 2024-04-24 MED ORDER — LISDEXAMFETAMINE DIMESYLATE 40 MG PO CAPS
40.0000 mg | ORAL_CAPSULE | Freq: Every day | ORAL | 0 refills | Status: DC
  Filled 2024-04-24: qty 30, 30d supply, fill #0

## 2024-04-24 MED ORDER — TRAZODONE HCL 50 MG PO TABS
50.0000 mg | ORAL_TABLET | Freq: Every day | ORAL | 0 refills | Status: DC
  Filled 2024-04-24: qty 90, 90d supply, fill #0

## 2024-04-25 ENCOUNTER — Other Ambulatory Visit (HOSPITAL_COMMUNITY): Payer: Self-pay

## 2024-04-26 DIAGNOSIS — F902 Attention-deficit hyperactivity disorder, combined type: Secondary | ICD-10-CM | POA: Diagnosis not present

## 2024-04-26 DIAGNOSIS — F331 Major depressive disorder, recurrent, moderate: Secondary | ICD-10-CM | POA: Diagnosis not present

## 2024-05-03 DIAGNOSIS — F331 Major depressive disorder, recurrent, moderate: Secondary | ICD-10-CM | POA: Diagnosis not present

## 2024-05-03 DIAGNOSIS — F902 Attention-deficit hyperactivity disorder, combined type: Secondary | ICD-10-CM | POA: Diagnosis not present

## 2024-05-17 DIAGNOSIS — F331 Major depressive disorder, recurrent, moderate: Secondary | ICD-10-CM | POA: Diagnosis not present

## 2024-05-17 DIAGNOSIS — F902 Attention-deficit hyperactivity disorder, combined type: Secondary | ICD-10-CM | POA: Diagnosis not present

## 2024-05-29 ENCOUNTER — Other Ambulatory Visit (HOSPITAL_COMMUNITY): Payer: Self-pay

## 2024-05-31 DIAGNOSIS — F331 Major depressive disorder, recurrent, moderate: Secondary | ICD-10-CM | POA: Diagnosis not present

## 2024-05-31 DIAGNOSIS — F902 Attention-deficit hyperactivity disorder, combined type: Secondary | ICD-10-CM | POA: Diagnosis not present

## 2024-06-01 ENCOUNTER — Other Ambulatory Visit (HOSPITAL_COMMUNITY): Payer: Self-pay

## 2024-06-01 MED ORDER — LISDEXAMFETAMINE DIMESYLATE 40 MG PO CAPS
40.0000 mg | ORAL_CAPSULE | Freq: Every day | ORAL | 0 refills | Status: DC
  Filled 2024-06-01: qty 30, 30d supply, fill #0

## 2024-06-01 MED ORDER — LISDEXAMFETAMINE DIMESYLATE 20 MG PO CAPS
20.0000 mg | ORAL_CAPSULE | Freq: Every day | ORAL | 0 refills | Status: AC
  Filled 2024-06-01: qty 30, 30d supply, fill #0

## 2024-06-07 DIAGNOSIS — F902 Attention-deficit hyperactivity disorder, combined type: Secondary | ICD-10-CM | POA: Diagnosis not present

## 2024-06-07 DIAGNOSIS — F331 Major depressive disorder, recurrent, moderate: Secondary | ICD-10-CM | POA: Diagnosis not present

## 2024-06-14 DIAGNOSIS — F331 Major depressive disorder, recurrent, moderate: Secondary | ICD-10-CM | POA: Diagnosis not present

## 2024-06-14 DIAGNOSIS — F902 Attention-deficit hyperactivity disorder, combined type: Secondary | ICD-10-CM | POA: Diagnosis not present

## 2024-06-21 DIAGNOSIS — F902 Attention-deficit hyperactivity disorder, combined type: Secondary | ICD-10-CM | POA: Diagnosis not present

## 2024-06-21 DIAGNOSIS — F331 Major depressive disorder, recurrent, moderate: Secondary | ICD-10-CM | POA: Diagnosis not present

## 2024-06-25 ENCOUNTER — Other Ambulatory Visit (HOSPITAL_COMMUNITY): Payer: Self-pay

## 2024-06-25 DIAGNOSIS — Z79899 Other long term (current) drug therapy: Secondary | ICD-10-CM | POA: Diagnosis not present

## 2024-06-25 DIAGNOSIS — F419 Anxiety disorder, unspecified: Secondary | ICD-10-CM | POA: Diagnosis not present

## 2024-06-25 DIAGNOSIS — F902 Attention-deficit hyperactivity disorder, combined type: Secondary | ICD-10-CM | POA: Diagnosis not present

## 2024-06-25 MED ORDER — LISDEXAMFETAMINE DIMESYLATE 40 MG PO CAPS
40.0000 mg | ORAL_CAPSULE | Freq: Every day | ORAL | 0 refills | Status: DC
  Filled 2024-07-05: qty 30, 30d supply, fill #0

## 2024-06-25 MED ORDER — LISDEXAMFETAMINE DIMESYLATE 20 MG PO CAPS
20.0000 mg | ORAL_CAPSULE | Freq: Every day | ORAL | 0 refills | Status: DC
  Filled 2024-07-05: qty 30, 30d supply, fill #0

## 2024-06-26 ENCOUNTER — Other Ambulatory Visit (HOSPITAL_COMMUNITY): Payer: Self-pay

## 2024-06-26 DIAGNOSIS — Z79899 Other long term (current) drug therapy: Secondary | ICD-10-CM | POA: Diagnosis not present

## 2024-06-26 DIAGNOSIS — F902 Attention-deficit hyperactivity disorder, combined type: Secondary | ICD-10-CM | POA: Diagnosis not present

## 2024-06-28 DIAGNOSIS — F902 Attention-deficit hyperactivity disorder, combined type: Secondary | ICD-10-CM | POA: Diagnosis not present

## 2024-06-28 DIAGNOSIS — F331 Major depressive disorder, recurrent, moderate: Secondary | ICD-10-CM | POA: Diagnosis not present

## 2024-06-29 ENCOUNTER — Other Ambulatory Visit (HOSPITAL_COMMUNITY): Payer: Self-pay

## 2024-06-29 MED ORDER — FLUOXETINE HCL 40 MG PO CAPS
40.0000 mg | ORAL_CAPSULE | Freq: Every day | ORAL | 3 refills | Status: AC
  Filled 2024-06-29: qty 62, 62d supply, fill #0
  Filled 2024-06-29: qty 28, 28d supply, fill #0
  Filled 2024-10-08: qty 90, 90d supply, fill #1

## 2024-07-05 ENCOUNTER — Other Ambulatory Visit (HOSPITAL_COMMUNITY): Payer: Self-pay

## 2024-07-05 DIAGNOSIS — F331 Major depressive disorder, recurrent, moderate: Secondary | ICD-10-CM | POA: Diagnosis not present

## 2024-07-05 DIAGNOSIS — F902 Attention-deficit hyperactivity disorder, combined type: Secondary | ICD-10-CM | POA: Diagnosis not present

## 2024-07-12 DIAGNOSIS — F902 Attention-deficit hyperactivity disorder, combined type: Secondary | ICD-10-CM | POA: Diagnosis not present

## 2024-07-12 DIAGNOSIS — F331 Major depressive disorder, recurrent, moderate: Secondary | ICD-10-CM | POA: Diagnosis not present

## 2024-07-19 DIAGNOSIS — F902 Attention-deficit hyperactivity disorder, combined type: Secondary | ICD-10-CM | POA: Diagnosis not present

## 2024-07-19 DIAGNOSIS — F331 Major depressive disorder, recurrent, moderate: Secondary | ICD-10-CM | POA: Diagnosis not present

## 2024-07-26 DIAGNOSIS — F331 Major depressive disorder, recurrent, moderate: Secondary | ICD-10-CM | POA: Diagnosis not present

## 2024-07-26 DIAGNOSIS — F902 Attention-deficit hyperactivity disorder, combined type: Secondary | ICD-10-CM | POA: Diagnosis not present

## 2024-07-30 DIAGNOSIS — H5213 Myopia, bilateral: Secondary | ICD-10-CM | POA: Diagnosis not present

## 2024-07-30 DIAGNOSIS — H52203 Unspecified astigmatism, bilateral: Secondary | ICD-10-CM | POA: Diagnosis not present

## 2024-08-02 DIAGNOSIS — F331 Major depressive disorder, recurrent, moderate: Secondary | ICD-10-CM | POA: Diagnosis not present

## 2024-08-02 DIAGNOSIS — F902 Attention-deficit hyperactivity disorder, combined type: Secondary | ICD-10-CM | POA: Diagnosis not present

## 2024-08-15 ENCOUNTER — Other Ambulatory Visit: Payer: Self-pay | Admitting: Family Medicine

## 2024-08-15 DIAGNOSIS — Z1231 Encounter for screening mammogram for malignant neoplasm of breast: Secondary | ICD-10-CM

## 2024-08-16 DIAGNOSIS — F902 Attention-deficit hyperactivity disorder, combined type: Secondary | ICD-10-CM | POA: Diagnosis not present

## 2024-08-16 DIAGNOSIS — F3281 Premenstrual dysphoric disorder: Secondary | ICD-10-CM | POA: Diagnosis not present

## 2024-08-23 DIAGNOSIS — F3281 Premenstrual dysphoric disorder: Secondary | ICD-10-CM | POA: Diagnosis not present

## 2024-08-23 DIAGNOSIS — F902 Attention-deficit hyperactivity disorder, combined type: Secondary | ICD-10-CM | POA: Diagnosis not present

## 2024-08-27 ENCOUNTER — Other Ambulatory Visit: Payer: Self-pay

## 2024-08-27 ENCOUNTER — Other Ambulatory Visit (HOSPITAL_COMMUNITY): Payer: Self-pay

## 2024-08-27 MED ORDER — ESOMEPRAZOLE MAGNESIUM 20 MG PO CPDR
20.0000 mg | DELAYED_RELEASE_CAPSULE | Freq: Every day | ORAL | 0 refills | Status: DC
  Filled 2024-08-27: qty 90, 90d supply, fill #0

## 2024-08-28 ENCOUNTER — Other Ambulatory Visit (HOSPITAL_COMMUNITY): Payer: Self-pay

## 2024-08-28 MED ORDER — LISDEXAMFETAMINE DIMESYLATE 20 MG PO CAPS
20.0000 mg | ORAL_CAPSULE | Freq: Every day | ORAL | 0 refills | Status: DC
  Filled 2024-08-28: qty 30, 30d supply, fill #0

## 2024-08-28 MED ORDER — LISDEXAMFETAMINE DIMESYLATE 40 MG PO CAPS
40.0000 mg | ORAL_CAPSULE | Freq: Every day | ORAL | 0 refills | Status: DC
  Filled 2024-08-28: qty 30, 30d supply, fill #0

## 2024-08-28 MED ORDER — TRAZODONE HCL 50 MG PO TABS
50.0000 mg | ORAL_TABLET | Freq: Every day | ORAL | 0 refills | Status: AC
  Filled 2024-08-28: qty 90, 90d supply, fill #0

## 2024-08-30 DIAGNOSIS — F902 Attention-deficit hyperactivity disorder, combined type: Secondary | ICD-10-CM | POA: Diagnosis not present

## 2024-08-30 DIAGNOSIS — F3281 Premenstrual dysphoric disorder: Secondary | ICD-10-CM | POA: Diagnosis not present

## 2024-09-04 ENCOUNTER — Ambulatory Visit

## 2024-09-06 ENCOUNTER — Ambulatory Visit
Admission: RE | Admit: 2024-09-06 | Discharge: 2024-09-06 | Disposition: A | Discharge status: Home / Self Care | Source: Ambulatory Visit | Attending: Family Medicine | Admitting: Family Medicine

## 2024-09-06 DIAGNOSIS — Z1231 Encounter for screening mammogram for malignant neoplasm of breast: Secondary | ICD-10-CM

## 2024-09-06 DIAGNOSIS — F902 Attention-deficit hyperactivity disorder, combined type: Secondary | ICD-10-CM | POA: Diagnosis not present

## 2024-09-06 DIAGNOSIS — F3281 Premenstrual dysphoric disorder: Secondary | ICD-10-CM | POA: Diagnosis not present

## 2024-09-18 ENCOUNTER — Other Ambulatory Visit (HOSPITAL_COMMUNITY): Payer: Self-pay

## 2024-09-18 MED ORDER — NORGESTIMATE-ETH ESTRADIOL 0.25-35 MG-MCG PO TABS
1.0000 | ORAL_TABLET | Freq: Every day | ORAL | 0 refills | Status: AC
  Filled 2024-09-18: qty 84, 84d supply, fill #0

## 2024-09-20 DIAGNOSIS — F3281 Premenstrual dysphoric disorder: Secondary | ICD-10-CM | POA: Diagnosis not present

## 2024-09-20 DIAGNOSIS — F902 Attention-deficit hyperactivity disorder, combined type: Secondary | ICD-10-CM | POA: Diagnosis not present

## 2024-09-27 DIAGNOSIS — F3281 Premenstrual dysphoric disorder: Secondary | ICD-10-CM | POA: Diagnosis not present

## 2024-09-27 DIAGNOSIS — F902 Attention-deficit hyperactivity disorder, combined type: Secondary | ICD-10-CM | POA: Diagnosis not present

## 2024-10-01 ENCOUNTER — Other Ambulatory Visit (HOSPITAL_COMMUNITY): Payer: Self-pay

## 2024-10-01 DIAGNOSIS — F419 Anxiety disorder, unspecified: Secondary | ICD-10-CM | POA: Diagnosis not present

## 2024-10-01 DIAGNOSIS — Z79899 Other long term (current) drug therapy: Secondary | ICD-10-CM | POA: Diagnosis not present

## 2024-10-01 DIAGNOSIS — F902 Attention-deficit hyperactivity disorder, combined type: Secondary | ICD-10-CM | POA: Diagnosis not present

## 2024-10-01 MED ORDER — LISDEXAMFETAMINE DIMESYLATE 20 MG PO CAPS
20.0000 mg | ORAL_CAPSULE | Freq: Every day | ORAL | 0 refills | Status: DC
  Filled 2024-10-01: qty 30, 30d supply, fill #0

## 2024-10-01 MED ORDER — LISDEXAMFETAMINE DIMESYLATE 40 MG PO CAPS
40.0000 mg | ORAL_CAPSULE | Freq: Every day | ORAL | 0 refills | Status: DC
  Filled 2024-10-01: qty 30, 30d supply, fill #0

## 2024-10-04 DIAGNOSIS — F3281 Premenstrual dysphoric disorder: Secondary | ICD-10-CM | POA: Diagnosis not present

## 2024-10-04 DIAGNOSIS — F902 Attention-deficit hyperactivity disorder, combined type: Secondary | ICD-10-CM | POA: Diagnosis not present

## 2024-10-30 ENCOUNTER — Other Ambulatory Visit (HOSPITAL_COMMUNITY): Payer: Self-pay

## 2024-10-30 MED ORDER — ESOMEPRAZOLE MAGNESIUM 20 MG PO CPDR
20.0000 mg | DELAYED_RELEASE_CAPSULE | Freq: Every morning | ORAL | 3 refills | Status: AC
  Filled 2024-10-30 – 2024-11-02 (×2): qty 90, 90d supply, fill #0

## 2024-11-02 ENCOUNTER — Other Ambulatory Visit (HOSPITAL_COMMUNITY): Payer: Self-pay

## 2024-11-02 MED ORDER — LISDEXAMFETAMINE DIMESYLATE 20 MG PO CAPS
20.0000 mg | ORAL_CAPSULE | Freq: Every day | ORAL | 0 refills | Status: AC
  Filled 2024-11-02: qty 30, 30d supply, fill #0

## 2024-11-02 MED ORDER — LISDEXAMFETAMINE DIMESYLATE 40 MG PO CAPS
40.0000 mg | ORAL_CAPSULE | Freq: Every day | ORAL | 0 refills | Status: AC
  Filled 2024-11-02: qty 30, 30d supply, fill #0

## 2024-11-02 MED ORDER — LEVOTHYROXINE SODIUM 112 MCG PO TABS
112.0000 ug | ORAL_TABLET | Freq: Every morning | ORAL | 3 refills | Status: AC
  Filled 2024-11-02: qty 90, 90d supply, fill #0

## 2024-11-05 ENCOUNTER — Other Ambulatory Visit (HOSPITAL_COMMUNITY): Payer: Self-pay
